# Patient Record
Sex: Male | Born: 1975 | Race: Black or African American | Hispanic: No | Marital: Single | Smoking: Current every day smoker
Health system: Southern US, Community
[De-identification: ages and names within clinical notes are randomized; demographics above are authoritative.]

## PROBLEM LIST (undated history)

## (undated) DIAGNOSIS — K5792 Diverticulitis of intestine, part unspecified, without perforation or abscess without bleeding: Secondary | ICD-10-CM

## (undated) DIAGNOSIS — K219 Gastro-esophageal reflux disease without esophagitis: Secondary | ICD-10-CM

## (undated) HISTORY — PX: NO PAST SURGERIES: SHX2092

## (undated) HISTORY — PX: WISDOM TOOTH EXTRACTION: SHX21

---

## 1998-02-16 ENCOUNTER — Emergency Department (HOSPITAL_COMMUNITY): Admission: EM | Admit: 1998-02-16 | Discharge: 1998-02-16 | Payer: Self-pay | Admitting: Emergency Medicine

## 1998-07-08 ENCOUNTER — Encounter: Payer: Self-pay | Admitting: Emergency Medicine

## 1998-07-08 ENCOUNTER — Emergency Department (HOSPITAL_COMMUNITY): Admission: EM | Admit: 1998-07-08 | Discharge: 1998-07-08 | Payer: Self-pay | Admitting: Emergency Medicine

## 1998-07-20 ENCOUNTER — Emergency Department (HOSPITAL_COMMUNITY): Admission: EM | Admit: 1998-07-20 | Discharge: 1998-07-20 | Payer: Self-pay | Admitting: Emergency Medicine

## 1998-07-21 ENCOUNTER — Encounter: Payer: Self-pay | Admitting: Emergency Medicine

## 1998-07-29 ENCOUNTER — Emergency Department (HOSPITAL_COMMUNITY): Admission: EM | Admit: 1998-07-29 | Discharge: 1998-07-29 | Payer: Self-pay | Admitting: Emergency Medicine

## 2000-02-14 ENCOUNTER — Emergency Department (HOSPITAL_COMMUNITY): Admission: EM | Admit: 2000-02-14 | Discharge: 2000-02-14 | Payer: Self-pay | Admitting: Emergency Medicine

## 2001-04-07 ENCOUNTER — Encounter: Payer: Self-pay | Admitting: *Deleted

## 2001-04-07 ENCOUNTER — Emergency Department (HOSPITAL_COMMUNITY): Admission: EM | Admit: 2001-04-07 | Discharge: 2001-04-07 | Payer: Self-pay | Admitting: Emergency Medicine

## 2011-07-14 ENCOUNTER — Emergency Department (HOSPITAL_COMMUNITY)
Admission: EM | Admit: 2011-07-14 | Discharge: 2011-07-14 | Disposition: A | Discharge status: Home / Self Care | Payer: Self-pay | Attending: Emergency Medicine | Admitting: Emergency Medicine

## 2011-07-14 ENCOUNTER — Encounter (HOSPITAL_COMMUNITY): Payer: Self-pay

## 2011-07-14 DIAGNOSIS — F17200 Nicotine dependence, unspecified, uncomplicated: Secondary | ICD-10-CM | POA: Insufficient documentation

## 2011-07-14 DIAGNOSIS — R109 Unspecified abdominal pain: Secondary | ICD-10-CM | POA: Insufficient documentation

## 2011-07-14 DIAGNOSIS — K921 Melena: Secondary | ICD-10-CM | POA: Insufficient documentation

## 2011-07-14 DIAGNOSIS — R195 Other fecal abnormalities: Secondary | ICD-10-CM

## 2011-07-14 LAB — COMPREHENSIVE METABOLIC PANEL
ALT: 11 U/L (ref 0–53)
AST: 15 U/L (ref 0–37)
Albumin: 3.3 g/dL — ABNORMAL LOW (ref 3.5–5.2)
Alkaline Phosphatase: 56 U/L (ref 39–117)
BUN: 15 mg/dL (ref 6–23)
CO2: 26 mEq/L (ref 19–32)
Calcium: 8.9 mg/dL (ref 8.4–10.5)
Chloride: 106 mEq/L (ref 96–112)
Creatinine, Ser: 0.95 mg/dL (ref 0.50–1.35)
GFR calc Af Amer: >90 mL/min (ref >90)
GFR calc non Af Amer: >90 mL/min (ref >90)
Glucose, Bld: 85 mg/dL (ref 70–99)
Potassium: 4 mEq/L (ref 3.5–5.1)
Sodium: 140 mEq/L (ref 135–145)
Total Bilirubin: 0.2 mg/dL — ABNORMAL LOW (ref 0.3–1.2)
Total Protein: 6.8 g/dL (ref 6.0–8.3)

## 2011-07-14 LAB — CBC
HCT: 41.2 % (ref 39.0–52.0)
Hemoglobin: 14.2 g/dL (ref 13.0–17.0)
MCH: 30.3 pg (ref 26.0–34.0)
MCHC: 34.5 g/dL (ref 30.0–36.0)
MCV: 87.8 fL (ref 78.0–100.0)
Platelets: 197 10*3/uL (ref 150–400)
RBC: 4.69 MIL/uL (ref 4.22–5.81)
RDW: 12.8 % (ref 11.5–15.5)
WBC: 9.8 10*3/uL (ref 4.0–10.5)

## 2011-07-14 LAB — PROTIME-INR
INR: 0.81 (ref 0.00–1.49)
Prothrombin Time: 11.4 seconds — ABNORMAL LOW (ref 11.6–15.2)

## 2011-07-14 LAB — APTT: aPTT: 30 seconds (ref 24–37)

## 2011-07-14 MED ORDER — RANITIDINE HCL 150 MG PO TABS: 150.0000 mg | ORAL_TABLET | Freq: Two times a day (BID) | ORAL | Status: DC

## 2011-07-14 MED ORDER — PANTOPRAZOLE SODIUM 40 MG IV SOLR
40.0000 mg | Freq: Once | INTRAVENOUS | Status: AC
  Administered 2011-07-14: 40 mg via INTRAVENOUS
  Filled 2011-07-14: qty 40

## 2011-07-14 MED ORDER — TRAMADOL HCL 50 MG PO TABS: 50.0000 mg | ORAL_TABLET | Freq: Four times a day (QID) | ORAL | Status: AC | PRN

## 2011-07-14 NOTE — ED Notes (Signed)
Patient given discharge instructions, information, prescriptions, and diet order. Patient states that they adequately understand discharge information given and to return to ED if symptoms return or worsen.    Patient given 2 rx's.

## 2011-07-14 NOTE — ED Notes (Signed)
Pt in from home with abd pain x1 week denies n/v/d states black stool, denies dizziness states pain in abd are is worse when eating spicy foods, states pain is lower abd non radiating

## 2011-07-14 NOTE — ED Provider Notes (Signed)
Medical screening examination/treatment/procedure(s) were performed by non-physician practitioner and as supervising physician I was immediately available for consultation/collaboration.   Rutha Melgoza L Gaby Harney, MD 07/14/11 2245 

## 2011-07-14 NOTE — ED Provider Notes (Signed)
History     CSN: [id number]  Arrival date & time 07/14/11  1716   First MD Initiated Contact with Patient 07/14/11 1853    3  Chief Complaint  Patient presents with  . Abdominal Pain    (Consider location/radiation/quality/duration/timing/severity/associated sxs/prior treatment) The history is provided by the patient and a friend.   36 y/o male INAD c/o dark stool intermittently x1 week. Denies CP, SOB, Palpations, Light headed sensation, N/V. Pt reports increase in flatus and bilateral lower abdominal discomfort. Pt has been taking ibuprofen for discomfort.    History reviewed. No pertinent past medical history.  History reviewed. No pertinent past surgical history.  History reviewed. No pertinent family history.  History  Substance Use Topics  . Smoking status: Current Everyday Smoker  . Smokeless tobacco: Not on file  . Alcohol Use: No      Review of Systems  Gastrointestinal: Positive for abdominal pain. Negative for rectal pain.  All other systems reviewed and are negative.    Allergies  Review of patient's allergies indicates no known allergies.  Home Medications   Current Outpatient Rx  Name Route Sig Dispense Refill  . IBUPROFEN 200 MG PO TABS Oral Take 200 mg by mouth every 6 (six) hours as needed. Pain    . RANITIDINE HCL 150 MG PO TABS Oral Take 1 tablet (150 mg total) by mouth 2 (two) times daily. 60 tablet 0  . TRAMADOL HCL 50 MG PO TABS Oral Take 1 tablet (50 mg total) by mouth every 6 (six) hours as needed for pain. 15 tablet 0    BP 105/67  Pulse 70  Temp 99.1 F (37.3 C) (Oral)  Resp 18  SpO2 99%  Physical Exam  Nursing note and vitals reviewed. Constitutional: He is oriented to person, place, and time. He appears well-developed and well-nourished. No distress.  HENT:  Head: Normocephalic.  Eyes: Conjunctivae and EOM are normal.  Cardiovascular: Normal rate, regular rhythm and normal heart sounds.   Pulmonary/Chest: Breath sounds  normal. He is in respiratory distress.  Abdominal: Soft. Bowel sounds are normal. He exhibits no distension and no mass. There is no tenderness. There is no rebound and no guarding.       One DRE, stool appears greenish, good rectal tone  Musculoskeletal: Normal range of motion.  Neurological: He is alert and oriented to person, place, and time.  Psychiatric: He has a normal mood and affect.    ED Course  Procedures (including critical care time)  Labs Reviewed  COMPREHENSIVE METABOLIC PANEL - Abnormal; Notable for the following:    Albumin 3.3 (*)     Total Bilirubin 0.2 (*)     All other components within normal limits  PROTIME-INR - Abnormal; Notable for the following:    Prothrombin Time 11.4 (*)     All other components within normal limits  CBC  APTT  OCCULT BLOOD X 1 CARD TO LAB, STOOL   No results found.   1. Heme positive stool       MDM  36 y/o male with positive occult blood in stool H/H is stable. Will d/c with zantac and tramadol.  Pt verbalized understanding and agrees with care plan. Outpatient follow-up and return precautions given.          Wynetta Emery, PA-C 07/14/11 2015

## 2011-10-31 ENCOUNTER — Encounter (HOSPITAL_COMMUNITY): Payer: Self-pay

## 2011-10-31 ENCOUNTER — Emergency Department (HOSPITAL_COMMUNITY)
Admission: EM | Admit: 2011-10-31 | Discharge: 2011-10-31 | Disposition: A | Discharge status: Home / Self Care | Payer: Self-pay | Attending: Emergency Medicine | Admitting: Emergency Medicine

## 2011-10-31 ENCOUNTER — Emergency Department (HOSPITAL_COMMUNITY): Payer: Self-pay

## 2011-10-31 DIAGNOSIS — Z79899 Other long term (current) drug therapy: Secondary | ICD-10-CM | POA: Insufficient documentation

## 2011-10-31 DIAGNOSIS — F17200 Nicotine dependence, unspecified, uncomplicated: Secondary | ICD-10-CM | POA: Insufficient documentation

## 2011-10-31 DIAGNOSIS — R0789 Other chest pain: Secondary | ICD-10-CM

## 2011-10-31 DIAGNOSIS — R059 Cough, unspecified: Secondary | ICD-10-CM | POA: Insufficient documentation

## 2011-10-31 DIAGNOSIS — R05 Cough: Secondary | ICD-10-CM | POA: Insufficient documentation

## 2011-10-31 DIAGNOSIS — R071 Chest pain on breathing: Secondary | ICD-10-CM | POA: Insufficient documentation

## 2011-10-31 LAB — CBC WITH DIFFERENTIAL/PLATELET
Basophils Absolute: 0 10*3/uL (ref 0.0–0.1)
Basophils Relative: 1 % (ref 0–1)
Eosinophils Absolute: 0.3 10*3/uL (ref 0.0–0.7)
Eosinophils Relative: 4 % (ref 0–5)
HCT: 44 % (ref 39.0–52.0)
Hemoglobin: 15.3 g/dL (ref 13.0–17.0)
Lymphocytes Relative: 36 % (ref 12–46)
Lymphs Abs: 2.9 10*3/uL (ref 0.7–4.0)
MCH: 30.4 pg (ref 26.0–34.0)
MCHC: 34.8 g/dL (ref 30.0–36.0)
MCV: 87.3 fL (ref 78.0–100.0)
Monocytes Absolute: 0.7 10*3/uL (ref 0.1–1.0)
Monocytes Relative: 9 % (ref 3–12)
Neutro Abs: 4 10*3/uL (ref 1.7–7.7)
Neutrophils Relative %: 50 % (ref 43–77)
Platelets: 187 10*3/uL (ref 150–400)
RBC: 5.04 MIL/uL (ref 4.22–5.81)
RDW: 13 % (ref 11.5–15.5)
WBC: 8 10*3/uL (ref 4.0–10.5)

## 2011-10-31 LAB — POCT I-STAT, CHEM 8
BUN: 19 mg/dL (ref 6–23)
Calcium, Ion: 1.17 mmol/L (ref 1.12–1.23)
Chloride: 105 mEq/L (ref 96–112)
Creatinine, Ser: 1.2 mg/dL (ref 0.50–1.35)
Glucose, Bld: 83 mg/dL (ref 70–99)
HCT: 48 % (ref 39.0–52.0)
Hemoglobin: 16.3 g/dL (ref 13.0–17.0)
Potassium: 4.1 mEq/L (ref 3.5–5.1)
Sodium: 141 mEq/L (ref 135–145)
TCO2: 24 mmol/L (ref 0–100)

## 2011-10-31 LAB — COMPREHENSIVE METABOLIC PANEL
ALT: 16 U/L (ref 0–53)
AST: 18 U/L (ref 0–37)
Albumin: 3.9 g/dL (ref 3.5–5.2)
Alkaline Phosphatase: 62 U/L (ref 39–117)
BUN: 17 mg/dL (ref 6–23)
CO2: 26 mEq/L (ref 19–32)
Calcium: 9.2 mg/dL (ref 8.4–10.5)
Chloride: 102 mEq/L (ref 96–112)
Creatinine, Ser: 0.98 mg/dL (ref 0.50–1.35)
GFR calc Af Amer: >90 mL/min (ref >90)
GFR calc non Af Amer: >90 mL/min (ref >90)
Glucose, Bld: 83 mg/dL (ref 70–99)
Potassium: 4.1 mEq/L (ref 3.5–5.1)
Sodium: 139 mEq/L (ref 135–145)
Total Bilirubin: 0.2 mg/dL — ABNORMAL LOW (ref 0.3–1.2)
Total Protein: 7.7 g/dL (ref 6.0–8.3)

## 2011-10-31 LAB — POCT I-STAT TROPONIN I: Troponin i, poc: 0 ng/mL (ref 0.00–0.08)

## 2011-10-31 MED ORDER — METHOCARBAMOL 500 MG PO TABS: 500.0000 mg | ORAL_TABLET | Freq: Two times a day (BID) | ORAL | Status: DC

## 2011-10-31 MED ORDER — NAPROXEN 500 MG PO TABS: 500.0000 mg | ORAL_TABLET | Freq: Two times a day (BID) | ORAL | Status: DC

## 2011-10-31 NOTE — ED Provider Notes (Signed)
Medical screening examination/treatment/procedure(s) were performed by non-physician practitioner and as supervising physician I was immediately available for consultation/collaboration.   Loren Racer, MD 10/31/11 1550

## 2011-10-31 NOTE — ED Notes (Signed)
Pt presents with NAD- chest pain x 1 day Pt reports when he moves his neck and left arm only he has pain- denies chest pain and SOB at present. denies injury

## 2011-10-31 NOTE — ED Provider Notes (Signed)
History     CSN: [id number]  Arrival date & time 10/31/11  1142   First MD Initiated Contact with Patient 10/31/11 1251      Chief Complaint  Patient presents with  . Chest Pain    (Consider location/radiation/quality/duration/timing/severity/associated sxs/prior treatment) Patient is a 36 y.o. male presenting with chest pain. The history is provided by the patient, a significant other and medical records.  Chest Pain Primary symptoms include cough. Pertinent negatives for primary symptoms include no fever, no fatigue, no shortness of breath, no wheezing, no abdominal pain, no nausea and no vomiting.  Pertinent negatives for associated symptoms include no diaphoresis.     Shane Arroyo is a 36 y.o. male presents to the emergency department complaining of chest pain.  The onset of the symptoms was  gradual starting 1 day ago; acutely worsening this morning.  The patient has associated chest pain with movement of the L arm or neck.  The symptoms have been  intermittent, gradually worsened.  Movement makes the symptoms worse and hot shower makes symptoms better for a short time.  The patient denies fever, chills, headache, neck pain, neck stiffness, shortness of breath, abdominal pain, nausea, vomiting, diarrhea, weakness, numbness, syncope.  Pt states the pain began yesterday morning and was mild.  He tolerated the pain throughout the day and when he awoke this morning it was acutely worse with movement.  He took a hot shower which made it feel better for a short time but the pain returned after 30 min.  Pt denies illness, cough, congestion, or strenuous lifting or activity.  Pt states cocaine use on Saturday without palpitations or other adverse side effects at the time.  Pt states pain feels like a pulled muscle, is located on the L side of the chest, rated at a 2/10, denies radiation.  Pt states the pain makes him feel scared.     History reviewed. No pertinent past medical  history.  History reviewed. No pertinent past surgical history.  No family history on file.  History  Substance Use Topics  . Smoking status: Current Every Day Smoker  . Smokeless tobacco: Not on file  . Alcohol Use: No      Review of Systems  Constitutional: Negative for fever, diaphoresis, appetite change, fatigue and unexpected weight change.  HENT: Negative for mouth sores, neck pain and neck stiffness.   Eyes: Negative for visual disturbance.  Respiratory: Positive for cough. Negative for chest tightness, shortness of breath and wheezing.   Cardiovascular: Positive for chest pain.  Gastrointestinal: Negative for nausea, vomiting, abdominal pain, diarrhea and constipation.  Genitourinary: Negative for dysuria, urgency, frequency and hematuria.  Musculoskeletal: Negative for back pain.  Skin: Negative for rash.  Neurological: Negative for syncope, light-headedness and headaches.  Hematological: Does not bruise/bleed easily.  Psychiatric/Behavioral: Negative for disturbed wake/sleep cycle. The patient is not nervous/anxious.   All other systems reviewed and are negative.    Allergies  Review of patient's allergies indicates no known allergies.  Home Medications   Current Outpatient Rx  Name Route Sig Dispense Refill  . NAPROXEN SODIUM 220 MG PO TABS Oral Take 440 mg by mouth 2 (two) times daily with a meal.    . METHOCARBAMOL 500 MG PO TABS Oral Take 1 tablet (500 mg total) by mouth 2 (two) times daily. 20 tablet 0    BP 90/52  Pulse 73  Temp 98.1 F (36.7 C) (Oral)  Resp 16  SpO2 99%  Physical Exam  Nursing note and vitals reviewed. Constitutional: He appears well-developed and well-nourished. No distress.  HENT:  Head: Normocephalic and atraumatic.  Mouth/Throat: Oropharynx is clear and moist. No oropharyngeal exudate.  Eyes: Conjunctivae normal are normal. No scleral icterus.  Neck: Normal range of motion. Neck supple.       Full ROM without pain in the  neck, but pain in the chest is reproduced at extremes of ROM to the L and R.    Cardiovascular: Normal rate, regular rhythm, normal heart sounds and intact distal pulses.  Exam reveals no gallop and no friction rub.   No murmur heard. Pulmonary/Chest: Effort normal and breath sounds normal. No respiratory distress. He has no wheezes. He has no rales. He exhibits no tenderness (no pain to palpation of the sternum or chest.  ).  Abdominal: Soft. Bowel sounds are normal. He exhibits no distension and no mass. There is no tenderness. There is no rebound and no guarding.  Musculoskeletal: Normal range of motion. He exhibits no edema.       Pain in the L chest reproduced with lifting of the L arm and increased with reaching across the chest.    Neurological: He is alert.       Speech is clear and goal oriented Moves extremities without ataxia  Skin: Skin is warm and dry. No rash noted. He is not diaphoretic. No erythema.  Psychiatric: He has a normal mood and affect.    ED Course  Procedures (including critical care time)   Results for orders placed during the hospital encounter of 10/31/11  COMPREHENSIVE METABOLIC PANEL      Component Value Range   Sodium 139  135 - 145 mEq/L   Potassium 4.1  3.5 - 5.1 mEq/L   Chloride 102  96 - 112 mEq/L   CO2 26  19 - 32 mEq/L   Glucose, Bld 83  70 - 99 mg/dL   BUN 17  6 - 23 mg/dL   Creatinine, Ser 1.61  0.50 - 1.35 mg/dL   Calcium 9.2  8.4 - 09.6 mg/dL   Total Protein 7.7  6.0 - 8.3 g/dL   Albumin 3.9  3.5 - 5.2 g/dL   AST 18  0 - 37 U/L   ALT 16  0 - 53 U/L   Alkaline Phosphatase 62  39 - 117 U/L   Total Bilirubin 0.2 (*) 0.3 - 1.2 mg/dL   GFR calc non Af Amer >90  >90 mL/min   GFR calc Af Amer >90  >90 mL/min  POCT I-STAT, CHEM 8      Component Value Range   Sodium 141  135 - 145 mEq/L   Potassium 4.1  3.5 - 5.1 mEq/L   Chloride 105  96 - 112 mEq/L   BUN 19  6 - 23 mg/dL   Creatinine, Ser 0.45  0.50 - 1.35 mg/dL   Glucose, Bld 83  70 - 99  mg/dL   Calcium, Ion 4.09  8.11 - 1.23 mmol/L   TCO2 24  0 - 100 mmol/L   Hemoglobin 16.3  13.0 - 17.0 g/dL   HCT 91.4  78.2 - 95.6 %  POCT I-STAT TROPONIN I      Component Value Range   Troponin i, poc 0.00  0.00 - 0.08 ng/mL   Comment 3           CBC WITH DIFFERENTIAL      Component Value Range   WBC 8.0  4.0 - 10.5 K/uL  RBC 5.04  4.22 - 5.81 MIL/uL   Hemoglobin 15.3  13.0 - 17.0 g/dL   HCT 16.1  09.6 - 04.5 %   MCV 87.3  78.0 - 100.0 fL   MCH 30.4  26.0 - 34.0 pg   MCHC 34.8  30.0 - 36.0 g/dL   RDW 40.9  81.1 - 91.4 %   Platelets 187  150 - 400 K/uL   Neutrophils Relative 50  43 - 77 %   Neutro Abs 4.0  1.7 - 7.7 K/uL   Lymphocytes Relative 36  12 - 46 %   Lymphs Abs 2.9  0.7 - 4.0 K/uL   Monocytes Relative 9  3 - 12 %   Monocytes Absolute 0.7  0.1 - 1.0 K/uL   Eosinophils Relative 4  0 - 5 %   Eosinophils Absolute 0.3  0.0 - 0.7 K/uL   Basophils Relative 1  0 - 1 %   Basophils Absolute 0.0  0.0 - 0.1 K/uL   Dg Chest 2 View  10/31/2011  *RADIOLOGY REPORT*  Clinical Data: Chest pain  CHEST - 2 VIEW  Comparison: None.  Findings: Heart size and vascularity are normal.  Lungs are clear without infiltrate or effusion.  Negative for mass lesion.  IMPRESSION: Negative   Original Report Authenticated By: Camelia Phenes, M.D.     ECG:  Date: 10/31/2011  Rate: 82  Rhythm: normal sinus rhythm  QRS Axis: normal  Intervals: normal  ST/T Wave abnormalities: normal  Conduction Disutrbances:none  Narrative Interpretation: Non-ischemic ECG  Old EKG Reviewed: none available   1. Chest pain, musculoskeletal   2. Left-sided chest wall pain     MDM  Lacharde G Purtle presents with reproducible chest pain.  Likely chest wall pain as it is atypical, sharp in nature and reproducible.  Patient is to be discharged with recommendation to follow up with PCP in regards to today's hospital visit. Chest pain is not likely of cardiac or pulmonary etiology d/t presentation, perc negative,  VSS, no tracheal deviation, no JVD or new murmur, RRR, breath sounds equal bilaterally, EKG without acute abnormalities, negative troponin, and negative CXR. Pt has been advised to start naprosyn and return to the ED if CP becomes exertional, associated with diaphoresis or nausea, radiates to left jaw/arm, worsens or becomes concerning in any way. Pt appears reliable for follow up and is agreeable to discharge.   Case has been discussed with Dr. Ranae Palms who agrees with the above plan to discharge.   1. Medications: naprosyn, robaxin 2. Treatment: rest, gentle stretching, take medications as prescribed 3. Follow Up: with PCP for further evaluation        Dierdre Forth, PA-C 10/31/11 1533

## 2012-04-30 ENCOUNTER — Ambulatory Visit: Payer: 59 | Admitting: Physician Assistant

## 2012-04-30 VITALS — BP 116/85 | HR 84 | Temp 98.0°F | Resp 16 | Ht 67.0 in | Wt 270.0 lb

## 2012-04-30 DIAGNOSIS — B353 Tinea pedis: Secondary | ICD-10-CM

## 2012-04-30 DIAGNOSIS — L299 Pruritus, unspecified: Secondary | ICD-10-CM

## 2012-04-30 LAB — POCT SKIN KOH: Skin KOH, POC: NEGATIVE

## 2012-04-30 MED ORDER — KETOCONAZOLE 2 % EX CREA: TOPICAL_CREAM | Freq: Every day | CUTANEOUS | Status: DC

## 2012-04-30 NOTE — Patient Instructions (Addendum)
Begin using the cream to the affected areas and between your toes once daily for 4 weeks.  If you are not better after 4 weeks, please let me know.  Make sure you keep the feet as dry as possible but changing socks, wearing open shoes, using foot powder, etc.   Athlete's Foot Athlete's foot (tinea pedis) is a fungal infection of the skin on the feet. It often occurs on the skin between the toes or underneath the toes. It can also occur on the soles of the feet. Athlete's foot is more likely to occur in hot, humid weather. Not washing your feet or changing your socks often enough can contribute to athlete's foot. The infection can spread from person to person (contagious). CAUSES Athlete's foot is caused by a fungus. This fungus thrives in warm, moist places. Most people get athlete's foot by sharing shower stalls, towels, and wet floors with an infected person. People with weakened immune systems, including those with diabetes, may be more likely to get athlete's foot. SYMPTOMS   Itchy areas between the toes or on the soles of the feet.  White, flaky, or scaly areas between the toes or on the soles of the feet.  Tiny, intensely itchy blisters between the toes or on the soles of the feet.  Tiny cuts on the skin. These cuts can develop a bacterial infection.  Thick or discolored toenails. DIAGNOSIS  Your caregiver can usually tell what the problem is by doing a physical exam. Your caregiver may also take a skin sample from the rash area. The skin sample may be examined under a microscope, or it may be tested to see if fungus will grow in the sample. A sample may also be taken from your toenail for testing. TREATMENT  Over-the-counter and prescription medicines can be used to kill the fungus. These medicines are available as powders or creams. Your caregiver can suggest medicines for you. Fungal infections respond slowly to treatment. You may need to continue using your medicine for several  weeks. PREVENTION   Do not share towels.  Wear sandals in wet areas, such as shared locker rooms and shared showers.  Keep your feet dry. Wear shoes that allow air to circulate. Wear cotton or wool socks. HOME CARE INSTRUCTIONS   Take medicines as directed by your caregiver. Do not use steroid creams on athlete's foot.  Keep your feet clean and cool. Wash your feet daily and dry them thoroughly, especially between your toes.  Change your socks every day. Wear cotton or wool socks. In hot climates, you may need to change your socks 2 to 3 times per day.  Wear sandals or canvas tennis shoes with good air circulation.  If you have blisters, soak your feet in Burow's solution or Epsom salts for 20 to 30 minutes, 2 times a day to dry out the blisters. Make sure you dry your feet thoroughly afterward. SEEK MEDICAL CARE IF:   You have a fever.  You have swelling, soreness, warmth, or redness in your foot.  You are not getting better after 7 days of treatment.  You are not completely cured after 30 days.  You have any problems caused by your medicines. MAKE SURE YOU:   Understand these instructions.  Will watch your condition.  Will get help right away if you are not doing well or get worse. Document Released: 12/23/1999 Document Revised: 03/19/2011 Document Reviewed: 10/13/2010 [facility] Patient Information 2013 [place], [place].

## 2012-04-30 NOTE — Progress Notes (Signed)
  Subjective:    Patient ID: Shane Arroyo, male    DOB: 12/13/1975, 37 y.o.   MRN: [id number]  HPI    Shane Arroyo is a very pleasant 37 yr old male here with concern for a rash on his feet.  His feet have been itchy for 1-2 months now.  Denies pain or drainage.  He has been using OTC antifungal cream which does not appear to be working.   He has had episodes of athlete's foot in the past.  States it recurs every couple years.  Has used both topical and oral medication in the past.  He does wear heavy work boots almost all the time.  No other contacts have symptoms.  He otherwise feels ok.     Review of Systems  Skin: Positive for rash (feet).  All other systems reviewed and are negative.       Objective:   Physical Exam  Vitals reviewed. Constitutional: He is oriented to person, place, and time. He appears well-developed and well-nourished. No distress.  HENT:  Head: Normocephalic and atraumatic.  Eyes: Conjunctivae are normal. No scleral icterus.  Pulmonary/Chest: Effort normal.  Neurological: He is alert and oriented to person, place, and time.  Skin: Skin is warm and dry. Rash noted.  Few erythematous scattered across dorsum of left foot, some scaling at the arch of the foot; erythematous scaling areas at arch of right foot; 4th/5th web spaces with maceration bilaterally  Psychiatric: He has a normal mood and affect. His behavior is normal.     Filed Vitals:   04/30/12 1314  BP: 116/85  Pulse: 84  Temp: 98 F (36.7 C)  Resp: 16     Results for orders placed in visit on 04/30/12  POCT SKIN KOH      Result Value Range   Skin KOH, POC Negative          Assessment & Plan:  Tinea pedis - Plan: ketoconazole (NIZORAL) 2 % cream  Pruritus - Plan: POCT Skin KOH  Shane Arroyo is a very pleasant 37 yr old male with pruritic rash of bilateral feet.  KOH scraping is negative but clinically this appears to be tinea.  Will treat with ketoconazole cream daily x 4 weeks.  Discussed with pt that he needs to try to keep his feet dry as much as possible - clean socks, foot powder, open to air when possible.  If not improved after 4 weeks of topical therapy, pt will let me know.  At that point we may need to try oral therapy.

## 2012-11-13 ENCOUNTER — Ambulatory Visit (INDEPENDENT_AMBULATORY_CARE_PROVIDER_SITE_OTHER): Payer: 59 | Admitting: Family Medicine

## 2012-11-13 VITALS — BP 120/80 | HR 86 | Temp 97.5°F | Resp 16 | Ht 67.0 in | Wt 253.0 lb

## 2012-11-13 DIAGNOSIS — Z202 Contact with and (suspected) exposure to infections with a predominantly sexual mode of transmission: Secondary | ICD-10-CM

## 2012-11-13 DIAGNOSIS — F1410 Cocaine abuse, uncomplicated: Secondary | ICD-10-CM

## 2012-11-13 DIAGNOSIS — E669 Obesity, unspecified: Secondary | ICD-10-CM

## 2012-11-13 DIAGNOSIS — Z2089 Contact with and (suspected) exposure to other communicable diseases: Secondary | ICD-10-CM

## 2012-11-13 DIAGNOSIS — F1210 Cannabis abuse, uncomplicated: Secondary | ICD-10-CM

## 2012-11-13 DIAGNOSIS — Z0000 Encounter for general adult medical examination without abnormal findings: Secondary | ICD-10-CM

## 2012-11-13 DIAGNOSIS — F1490 Cocaine use, unspecified, uncomplicated: Secondary | ICD-10-CM

## 2012-11-13 LAB — LIPID PANEL
Cholesterol: 119 mg/dL (ref 0–200)
HDL: 41 mg/dL (ref >39)
LDL Cholesterol: 65 mg/dL (ref 0–99)
Total CHOL/HDL Ratio: 2.9 Ratio
Triglycerides: 67 mg/dL (ref <150)
VLDL: 13 mg/dL (ref 0–40)

## 2012-11-13 LAB — POCT CBC
Granulocyte percent: 56.6 %G (ref 37–80)
HCT, POC: 47.7 % (ref 43.5–53.7)
Hemoglobin: 15.4 g/dL (ref 14.1–18.1)
Lymph, poc: 2.7 (ref 0.6–3.4)
MCH, POC: 30.1 pg (ref 27–31.2)
MCHC: 32.3 g/dL (ref 31.8–35.4)
MCV: 93.1 fL (ref 80–97)
MID (cbc): 0.5 (ref 0–0.9)
MPV: 9 fL (ref 0–99.8)
POC Granulocyte: 4.2 (ref 2–6.9)
POC LYMPH PERCENT: 36.7 %L (ref 10–50)
POC MID %: 6.7 %M (ref 0–12)
Platelet Count, POC: 212 10*3/uL (ref 142–424)
RBC: 5.12 M/uL (ref 4.69–6.13)
RDW, POC: 14.3 %
WBC: 7.4 10*3/uL (ref 4.6–10.2)

## 2012-11-13 LAB — COMPREHENSIVE METABOLIC PANEL
ALT: 14 U/L (ref 0–53)
AST: 16 U/L (ref 0–37)
Albumin: 4.2 g/dL (ref 3.5–5.2)
Alkaline Phosphatase: 55 U/L (ref 39–117)
BUN: 13 mg/dL (ref 6–23)
CO2: 26 mEq/L (ref 19–32)
Calcium: 9.6 mg/dL (ref 8.4–10.5)
Chloride: 103 mEq/L (ref 96–112)
Creat: 1.02 mg/dL (ref 0.50–1.35)
Glucose, Bld: 83 mg/dL (ref 70–99)
Potassium: 4.3 mEq/L (ref 3.5–5.3)
Sodium: 137 mEq/L (ref 135–145)
Total Bilirubin: 0.3 mg/dL (ref 0.3–1.2)
Total Protein: 7.6 g/dL (ref 6.0–8.3)

## 2012-11-13 NOTE — Progress Notes (Signed)
Complete physical:  History: Mr. Shane Arroyo is a 37 year old man here for physical examination. He has generally been pretty healthy. He is concerned about his STD risks. His girlfriend had concerns about HSV, as to whether he had spread that. He does not know of having had herpes. He had a fungus on his feet and he would like some more of the cream for that.  Past medical history: Operations: None Hospitalizations: None Major illnesses: None Regular medications: None Allergies: None  Family history: Mother is healthy Father has diabetes and high blood pressure Siblings are all living and well. His grandparents are deceased.  Social history: He does not work. He was incarcerated for 4 years until 2010, for drug trafficking. He subsequently lives off of his mother and girlfriend and others. He smokes one pack of cigarettes a day. Drinks a couple drinks a week. He is a regular user of marijuana. He smokes it every day. He uses occasional cocaine a couple times a week. He is single but sexually involved. Has a high school education.  Review of systems: Constitutional: Unremarkable HEENT: Unremarkable Respiratory: Unremarkable Cardiovascular: Unremarkable Gastrointestinal: Unremarkable Endocrinologic: Unremarkable Genitourinary: Unremarkable Musculoskeletal: Unremarkable Allergy immunology: Unremarkable Neurological: Unremarkable Hematologic: Unremarkable Psychiatric: Unremarkable Dermatologic had a ringworm infection on his feet, which resolved with ketoconazole, but he would like some more cream to have on hand if necessary  Physical examination: Moderately overweight Afro-American male in no acute distress. Fully alert and oriented. His TMs are normal. Eyes PERRLA. Fundi benign. Discs flat. Throat clear. Teeth good. Neck supple without nodes or thyromegaly. No carotid bruits. Chest is clear to auscultation. Heart regular without murmurs gallops or arrhythmias. Abdomen soft without  mass or tenderness. Normal male external genitalia with testes descended. No evidence of STDs. One little skin tag on the left proximal aspect of the shaft of the penis. No hernias. Extremities unremarkable. Skin unremarkable.  Assessment: Normal physical examination Overweight Excessive substance use Tobacco abuse Possible STD exposure  Plan: Check STD testing and labs. Talked to him about changing his lifestyle choices if he chooses to be healthy. He needs to get away from the substances and tobacco. He needs to get himself a job. He needs to continue getting regular exercise but learned eat less and lose weight.

## 2012-11-13 NOTE — Patient Instructions (Signed)
If you have more fungus on your feet, use over-the-counter terbinafine cream  Exercise regularly  Eat less. You to followup on fruits and vegetables and lean meats  Stopped smoking pot and using cocaine  Get a job or spleen your time volunteering  I will let you know the results of your test

## 2012-11-14 LAB — GC/CHLAMYDIA PROBE AMP
CT Probe RNA: NEGATIVE
GC Probe RNA: NEGATIVE

## 2012-11-14 LAB — HIV ANTIBODY (ROUTINE TESTING W REFLEX): HIV: NONREACTIVE

## 2012-11-14 LAB — RPR

## 2012-11-14 LAB — HSV(HERPES SIMPLEX VRS) I + II AB-IGG
HSV 1 Glycoprotein G Ab, IgG: 0.72 IV
HSV 2 Glycoprotein G Ab, IgG: 7.95 IV — ABNORMAL HIGH

## 2012-11-17 ENCOUNTER — Encounter: Payer: Self-pay | Admitting: Family Medicine

## 2012-12-27 ENCOUNTER — Emergency Department (HOSPITAL_COMMUNITY)
Admission: EM | Admit: 2012-12-27 | Discharge: 2012-12-28 | Disposition: A | Discharge status: Home / Self Care | Payer: 59 | Attending: Emergency Medicine | Admitting: Emergency Medicine

## 2012-12-27 ENCOUNTER — Encounter (HOSPITAL_COMMUNITY): Payer: Self-pay | Admitting: Emergency Medicine

## 2012-12-27 DIAGNOSIS — R0789 Other chest pain: Secondary | ICD-10-CM | POA: Insufficient documentation

## 2012-12-27 DIAGNOSIS — R52 Pain, unspecified: Secondary | ICD-10-CM | POA: Insufficient documentation

## 2012-12-27 DIAGNOSIS — IMO0001 Reserved for inherently not codable concepts without codable children: Secondary | ICD-10-CM | POA: Insufficient documentation

## 2012-12-27 DIAGNOSIS — B9789 Other viral agents as the cause of diseases classified elsewhere: Secondary | ICD-10-CM

## 2012-12-27 DIAGNOSIS — R5383 Other fatigue: Secondary | ICD-10-CM | POA: Insufficient documentation

## 2012-12-27 DIAGNOSIS — J069 Acute upper respiratory infection, unspecified: Secondary | ICD-10-CM | POA: Insufficient documentation

## 2012-12-27 DIAGNOSIS — F17200 Nicotine dependence, unspecified, uncomplicated: Secondary | ICD-10-CM | POA: Insufficient documentation

## 2012-12-27 DIAGNOSIS — R5381 Other malaise: Secondary | ICD-10-CM | POA: Insufficient documentation

## 2012-12-27 MED ORDER — ACETAMINOPHEN 325 MG PO TABS
650.0000 mg | ORAL_TABLET | Freq: Once | ORAL | Status: AC
  Administered 2012-12-28: 650 mg via ORAL
  Filled 2012-12-27: qty 2

## 2012-12-27 NOTE — ED Notes (Addendum)
Patient presents with flu like symptoms starting today, cough, runny nose, and generalized body aches. Patient febrile on arrival to ED. Patient has taken cough drops at home without relief. Patient denies receiving flu shot this year. Patient reports recent contact with someone at work who had similar symptoms.

## 2012-12-27 NOTE — ED Notes (Signed)
Pt states just out of blue he started feeling bad today,  Runny nose , cough,  Body aches. Hasn't taken anything but halls doesn't know if he has a fever,  Hasn't checked it.

## 2012-12-28 ENCOUNTER — Emergency Department (HOSPITAL_COMMUNITY): Payer: 59

## 2012-12-28 MED ORDER — IBUPROFEN 800 MG PO TABS: 800.0000 mg | ORAL_TABLET | Freq: Three times a day (TID) | ORAL | Status: DC

## 2012-12-28 MED ORDER — ALBUTEROL SULFATE HFA 108 (90 BASE) MCG/ACT IN AERS: 2.0000 | INHALATION_SPRAY | RESPIRATORY_TRACT | Status: DC | PRN

## 2012-12-28 NOTE — ED Notes (Signed)
Katie, PA at bedside.

## 2012-12-28 NOTE — ED Provider Notes (Signed)
CSN: [id number]     Arrival date & time 12/27/12  2323 History   First MD Initiated Contact with Patient 12/27/12 2341     Chief Complaint  Patient presents with  . Fatigue  . Cough   (Consider location/radiation/quality/duration/timing/severity/associated sxs/prior Treatment) HPI History provided by pt.   Pt presents w/ acute onset body aches, generalized weakness, fatigue this afternoon.  Associated w/ mild cough, diffuse chest tightness and nasal congestion.  Denies sore throat, N/V/D, urinary sx, rash.  Has taken theraflu w/out relief.  Recent URI exposure.   History reviewed. No pertinent past medical history. No past surgical history on file. Family History  Problem Relation Age of Onset  . Diabetes Father   . Cancer Maternal Grandmother   . Alcohol abuse Maternal Grandfather   . Diabetes Paternal Grandmother   . Diabetes Paternal Grandfather    History  Substance Use Topics  . Smoking status: Current Every Day Smoker  . Smokeless tobacco: Not on file  . Alcohol Use: No    Review of Systems  All other systems reviewed and are negative.    Allergies  Review of patient's allergies indicates no known allergies.  Home Medications   Current Outpatient Rx  Name  Route  Sig  Dispense  Refill  . naproxen sodium (ANAPROX) 220 MG tablet   Oral   Take 220 mg by mouth 2 (two) times daily as needed (pain).         . pseudoephedrine-acetaminophen (TYLENOL SINUS) 30-500 MG TABS   Oral   Take 1 tablet by mouth every 6 (six) hours as needed (cold symptoms).          BP 125/96  Pulse 120  Temp(Src) 100.3 F (37.9 C) (Oral)  Resp 22  SpO2 96% Physical Exam  Nursing note and vitals reviewed. Constitutional: He is oriented to person, place, and time. He appears well-developed and well-nourished. No distress.  Uncomfortable appearing  HENT:  Head: Normocephalic and atraumatic.  Mouth/Throat: Oropharynx is clear and moist. No oropharyngeal exudate.  Eyes:  Normal  appearance  Neck: Normal range of motion.  Cardiovascular: Normal rate and regular rhythm.   Pulmonary/Chest: Effort normal and breath sounds normal. No respiratory distress.  Musculoskeletal: Normal range of motion.  Lymphadenopathy:    He has no cervical adenopathy.  Neurological: He is alert and oriented to person, place, and time.  Skin: Skin is warm and dry. No rash noted.  Psychiatric: He has a normal mood and affect. His behavior is normal.    ED Course  Procedures (including critical care time) Labs Review Labs Reviewed - No data to display Imaging Review Dg Chest 2 View  12/28/2012   CLINICAL DATA:  37 year old male with cough, fever and body aches.  EXAM: CHEST  2 VIEW  COMPARISON:  10/31/2011  FINDINGS: The cardiomediastinal silhouette is unremarkable.  There is no evidence of focal airspace disease, pulmonary edema, suspicious pulmonary nodule/mass, pleural effusion, or pneumothorax. No acute bony abnormalities are identified.  IMPRESSION: No evidence of active cardiopulmonary disease.   Electronically Signed   By: Laveda Abbe M.D.   On: 12/28/2012 01:56    EKG Interpretation   None       MDM   1. Viral respiratory illness    37yo healthy M presents w/ flu-like sx since this afternoon.  Has diffuse tightness in chest, particularly when he coughs. Borderline febrile and tachycardic, non-toxic appearing, no respiratory distress, nml breath sounds on initial exam.  Pt drinking fluids and  has received tylenol.  Will reassess shortly.  12:25 AM   CXR ordered because pt remained tachycardic despite improvement in temperature and drinking fluids.  Neg for pna.  Results discussed w/ pt.  Will treat symptomatically for viral URI.  Prescribed an albuterol inhaler for chest tightness/cough, as well as 800mg  ibuprofen.  Recommended sudafed, rest and fluids.  Return precautions discussed.  3:20 AM     Otilio Miu, PA-C 12/28/12 2145

## 2012-12-28 NOTE — ED Notes (Addendum)
Patient given water x3, and of gingerale x2. Patient encouraged to drink fluids.

## 2012-12-28 NOTE — ED Provider Notes (Signed)
Medical screening examination/treatment/procedure(s) were performed by non-physician practitioner and as supervising physician I was immediately available for consultation/collaboration.    Olivia Mackie, MD 12/28/12 2158

## 2012-12-28 NOTE — ED Notes (Signed)
Patient drank of water previously placed a bedside. Patient sleeping when nurse entered room for reassessment.

## 2012-12-28 NOTE — ED Notes (Signed)
Patient awakened and encouraged to drink his fluids. Patient informed the alternative is to have an IV placed.

## 2013-04-21 ENCOUNTER — Telehealth: Payer: Self-pay

## 2013-04-21 NOTE — Telephone Encounter (Signed)
Labs good except for he has had genital herpes. No real need for treatment unless he has any active sores on his genitalia. If he does, we will treat. If not, he can come in in the future if he gets any active lesions like blisters on penis or surrounding areas. Treatment will not make a significant difference at this time. He is potentially infectious to his partners, so should always wear a condom. There is no way of telling how long he has been carrying the virus.   Reviewed labs with patient, patient states understanding.

## 2013-07-06 ENCOUNTER — Ambulatory Visit (INDEPENDENT_AMBULATORY_CARE_PROVIDER_SITE_OTHER): Payer: 59 | Admitting: Emergency Medicine

## 2013-07-06 VITALS — BP 110/70 | HR 70 | Temp 97.5°F | Resp 16 | Ht 66.5 in | Wt 257.0 lb

## 2013-07-06 DIAGNOSIS — K2970 Gastritis, unspecified, without bleeding: Secondary | ICD-10-CM

## 2013-07-06 DIAGNOSIS — K2990 Gastroduodenitis, unspecified, without bleeding: Principal | ICD-10-CM

## 2013-07-06 DIAGNOSIS — B353 Tinea pedis: Secondary | ICD-10-CM

## 2013-07-06 MED ORDER — SUCRALFATE 1 G PO TABS: ORAL_TABLET | ORAL | Status: DC

## 2013-07-06 MED ORDER — LANSOPRAZOLE 30 MG PO CPDR: 30.0000 mg | DELAYED_RELEASE_CAPSULE | Freq: Every day | ORAL | Status: DC

## 2013-07-06 MED ORDER — TERBINAFINE HCL 250 MG PO TABS: 250.0000 mg | ORAL_TABLET | Freq: Every day | ORAL | Status: DC

## 2013-07-06 NOTE — Progress Notes (Signed)
[facility] [address] [place] [address] [phone]  Date:  07/06/2013   Name:  Shane Arroyo   DOB:  [birth year]   MRN:  [id number]  PCP:  No PCP Per Patient    Chief Complaint: Abdominal Pain and Tinea Pedis   History of Present Illness:  Shane Arroyo is a 38 y.o. very pleasant male patient who presents with the following:  1 month duration pain in epigastrium.  No nausea or vomiting.  Eating provokes pain, particularly "acid" foods, tomato sauce, coffee.  No hematemesis, melena or hematochezia.  No stool change.  No jaundice.  Says he has had weight loss but offers no numbers.  Smokes. No improvement with over the counter medications or other home remedies. Denies other complaint or health concern today.   There are no active problems to display for this patient.   History reviewed. No pertinent past medical history.  History reviewed. No pertinent past surgical history.  History  Substance Use Topics  . Smoking status: Current Every Day Smoker  . Smokeless tobacco: Not on file  . Alcohol Use: No    Family History  Problem Relation Age of Onset  . Diabetes Father   . Cancer Maternal Grandmother   . Alcohol abuse Maternal Grandfather   . Diabetes Paternal Grandmother   . Diabetes Paternal Grandfather     No Known Allergies  Medication list has been reviewed and updated.  Current Outpatient Prescriptions on File Prior to Visit  Medication Sig Dispense Refill  . albuterol (PROVENTIL HFA;VENTOLIN HFA) 108 (90 BASE) MCG/ACT inhaler Inhale 2 puffs into the lungs every 4 (four) hours as needed for wheezing or shortness of breath.  1 Inhaler  0  . naproxen sodium (ANAPROX) 220 MG tablet Take 220 mg by mouth 2 (two) times daily as needed (pain).       No current facility-administered medications on file prior to visit.    Review of Systems:  As per HPI, otherwise negative.    Physical Examination: Filed Vitals:   07/06/13  1416  BP: 110/70  Pulse: 70  Temp: 97.5 F (36.4 C)  Resp: 16   Filed Vitals:   07/06/13 1416  Height: 5' 6.5" (1.689 m)  Weight: 257 lb (116.574 kg)   Body mass index is 40.86 kg/(m^2). Ideal Body Weight: Weight in (lb) to have BMI = 25: 156.9  GEN: WDWN, NAD, Non-toxic, A & O x 3 HEENT: Atraumatic, Normocephalic. Neck supple. No masses, No LAD. Ears and Nose: No external deformity. CV: RRR, No M/G/R. No JVD. No thrill. No extra heart sounds. PULM: CTA B, no wheezes, crackles, rhonchi. No retractions. No resp. distress. No accessory muscle use. ABD: S, tender epigastrium, ND, +BS. No rebound. No HSM. EXTR: No c/c/e NEURO Normal gait.  PSYCH: Normally interactive. Conversant. Not depressed or anxious appearing.  Calm demeanor.  Skin:  Tinea pedis  Assessment and Plan: Gastritis vs PUD Tinea pedis lamisil carafate Prevacid Labs pending  Signed,  Ellison Carwin, MD

## 2013-07-06 NOTE — Patient Instructions (Signed)

## 2013-07-07 LAB — COMPREHENSIVE METABOLIC PANEL
ALT: 12 U/L (ref 0–53)
AST: 14 U/L (ref 0–37)
Albumin: 4.1 g/dL (ref 3.5–5.2)
Alkaline Phosphatase: 56 U/L (ref 39–117)
BUN: 15 mg/dL (ref 6–23)
CO2: 29 mEq/L (ref 19–32)
Calcium: 9.1 mg/dL (ref 8.4–10.5)
Chloride: 103 mEq/L (ref 96–112)
Creat: 1.02 mg/dL (ref 0.50–1.35)
Glucose, Bld: 78 mg/dL (ref 70–99)
Potassium: 4.4 mEq/L (ref 3.5–5.3)
Sodium: 138 mEq/L (ref 135–145)
Total Bilirubin: 0.4 mg/dL (ref 0.2–1.2)
Total Protein: 6.9 g/dL (ref 6.0–8.3)

## 2013-07-07 LAB — CBC WITH DIFFERENTIAL/PLATELET
Basophils Absolute: 0 10*3/uL (ref 0.0–0.1)
Basophils Relative: 0 % (ref 0–1)
Eosinophils Absolute: 0.2 10*3/uL (ref 0.0–0.7)
Eosinophils Relative: 2 % (ref 0–5)
HCT: 44.8 % (ref 39.0–52.0)
Hemoglobin: 15.5 g/dL (ref 13.0–17.0)
Lymphocytes Relative: 34 % (ref 12–46)
Lymphs Abs: 3.1 10*3/uL (ref 0.7–4.0)
MCH: 29.4 pg (ref 26.0–34.0)
MCHC: 34.6 g/dL (ref 30.0–36.0)
MCV: 84.8 fL (ref 78.0–100.0)
Monocytes Absolute: 0.9 10*3/uL (ref 0.1–1.0)
Monocytes Relative: 10 % (ref 3–12)
Neutro Abs: 4.9 10*3/uL (ref 1.7–7.7)
Neutrophils Relative %: 54 % (ref 43–77)
Platelets: 217 10*3/uL (ref 150–400)
RBC: 5.28 MIL/uL (ref 4.22–5.81)
RDW: 13.5 % (ref 11.5–15.5)
WBC: 9.1 10*3/uL (ref 4.0–10.5)

## 2013-07-07 LAB — H. PYLORI ANTIBODY, IGG: H Pylori IgG: <0.4 {ISR}

## 2013-07-15 ENCOUNTER — Telehealth: Payer: Self-pay

## 2013-07-15 NOTE — Telephone Encounter (Signed)
yep

## 2013-07-15 NOTE — Telephone Encounter (Signed)
Spoke to [employer] she understands to purchase the Prevacid OTC and agrees with plan.

## 2013-07-15 NOTE — Telephone Encounter (Signed)
terbinafine (LAMISIL) 250 MG tablet Not covered by insurance   Alternative covered are:  Pancolpozole;  omniprazole  Needs prior authorization.  Needs Dr to rewrite the script and have it prior authorized with insurance company.  [employer]   [phone]

## 2013-07-15 NOTE — Telephone Encounter (Signed)
Is it ok for pt to just purchase Prevacid OTC instead of getting the prescription?

## 2015-06-10 ENCOUNTER — Ambulatory Visit: Payer: 59 | Attending: Physician Assistant | Admitting: Physical Therapy

## 2015-06-10 DIAGNOSIS — M6281 Muscle weakness (generalized): Secondary | ICD-10-CM | POA: Diagnosis present

## 2015-06-10 DIAGNOSIS — M5441 Lumbago with sciatica, right side: Secondary | ICD-10-CM | POA: Diagnosis not present
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
## 2015-06-10 NOTE — Patient Instructions (Signed)
Shane Arroyo PT [facility] [address], [address] [place], [place] [address] Phone # [phone] Fax [phone]    

## 2015-06-12 ENCOUNTER — Emergency Department (HOSPITAL_COMMUNITY)
Admission: EM | Admit: 2015-06-12 | Discharge: 2015-06-12 | Disposition: A | Discharge status: Home / Self Care | Payer: 59 | Attending: Emergency Medicine | Admitting: Emergency Medicine

## 2015-06-12 ENCOUNTER — Emergency Department (HOSPITAL_COMMUNITY): Payer: 59

## 2015-06-12 ENCOUNTER — Encounter (HOSPITAL_COMMUNITY): Payer: Self-pay

## 2015-06-12 DIAGNOSIS — Z79899 Other long term (current) drug therapy: Secondary | ICD-10-CM | POA: Diagnosis not present

## 2015-06-12 DIAGNOSIS — K5792 Diverticulitis of intestine, part unspecified, without perforation or abscess without bleeding: Secondary | ICD-10-CM | POA: Insufficient documentation

## 2015-06-12 DIAGNOSIS — Z791 Long term (current) use of non-steroidal anti-inflammatories (NSAID): Secondary | ICD-10-CM | POA: Diagnosis not present

## 2015-06-12 DIAGNOSIS — R1032 Left lower quadrant pain: Secondary | ICD-10-CM | POA: Diagnosis present

## 2015-06-12 DIAGNOSIS — F17200 Nicotine dependence, unspecified, uncomplicated: Secondary | ICD-10-CM | POA: Insufficient documentation

## 2015-06-12 LAB — URINALYSIS, ROUTINE W REFLEX MICROSCOPIC
Bilirubin Urine: NEGATIVE
Glucose, UA: NEGATIVE mg/dL
Hgb urine dipstick: NEGATIVE
Ketones, ur: NEGATIVE mg/dL
Leukocytes, UA: NEGATIVE
Nitrite: NEGATIVE
Protein, ur: NEGATIVE mg/dL
Specific Gravity, Urine: 1.031 — ABNORMAL HIGH (ref 1.005–1.030)
pH: 6.5 (ref 5.0–8.0)

## 2015-06-12 LAB — COMPREHENSIVE METABOLIC PANEL
ALT: 17 U/L (ref 17–63)
AST: 25 U/L (ref 15–41)
Albumin: 4.3 g/dL (ref 3.5–5.0)
Alkaline Phosphatase: 63 U/L (ref 38–126)
Anion gap: 9 (ref 5–15)
BUN: 16 mg/dL (ref 6–20)
CO2: 25 mmol/L (ref 22–32)
Calcium: 9 mg/dL (ref 8.9–10.3)
Chloride: 104 mmol/L (ref 101–111)
Creatinine, Ser: 0.98 mg/dL (ref 0.61–1.24)
GFR calc Af Amer: >60 mL/min (ref >60)
GFR calc non Af Amer: >60 mL/min (ref >60)
Glucose, Bld: 100 mg/dL — ABNORMAL HIGH (ref 65–99)
Potassium: 4.2 mmol/L (ref 3.5–5.1)
Sodium: 138 mmol/L (ref 135–145)
Total Bilirubin: 0.8 mg/dL (ref 0.3–1.2)
Total Protein: 8.1 g/dL (ref 6.5–8.1)

## 2015-06-12 LAB — LIPASE, BLOOD: Lipase: 27 U/L (ref 11–51)

## 2015-06-12 LAB — CBC
HCT: 45 % (ref 39.0–52.0)
Hemoglobin: 15.6 g/dL (ref 13.0–17.0)
MCH: 29.9 pg (ref 26.0–34.0)
MCHC: 34.7 g/dL (ref 30.0–36.0)
MCV: 86.4 fL (ref 78.0–100.0)
Platelets: 189 10*3/uL (ref 150–400)
RBC: 5.21 MIL/uL (ref 4.22–5.81)
RDW: 13.3 % (ref 11.5–15.5)
WBC: 8.6 10*3/uL (ref 4.0–10.5)

## 2015-06-12 MED ORDER — CIPROFLOXACIN IN D5W 400 MG/200ML IV SOLN
400.0000 mg | Freq: Once | INTRAVENOUS | Status: AC
  Administered 2015-06-12: 400 mg via INTRAVENOUS
  Filled 2015-06-12: qty 200

## 2015-06-12 MED ORDER — METRONIDAZOLE 500 MG PO TABS: 500.0000 mg | ORAL_TABLET | Freq: Four times a day (QID) | ORAL | Status: DC

## 2015-06-12 MED ORDER — ONDANSETRON HCL 4 MG/2ML IJ SOLN
4.0000 mg | Freq: Once | INTRAMUSCULAR | Status: AC
  Administered 2015-06-12: 4 mg via INTRAVENOUS
  Filled 2015-06-12: qty 2

## 2015-06-12 MED ORDER — CIPROFLOXACIN HCL 500 MG PO TABS: 500.0000 mg | ORAL_TABLET | Freq: Two times a day (BID) | ORAL | Status: DC

## 2015-06-12 MED ORDER — SODIUM CHLORIDE 0.9 % IV BOLUS (SEPSIS)
1000.0000 mL | Freq: Once | INTRAVENOUS | Status: AC
  Administered 2015-06-12: 1000 mL via INTRAVENOUS

## 2015-06-12 MED ORDER — IOPAMIDOL (ISOVUE-300) INJECTION 61%
100.0000 mL | Freq: Once | INTRAVENOUS | Status: AC | PRN
  Administered 2015-06-12: 100 mL via INTRAVENOUS

## 2015-06-12 MED ORDER — HYDROCODONE-ACETAMINOPHEN 5-325 MG PO TABS: 1.0000 | ORAL_TABLET | Freq: Four times a day (QID) | ORAL | Status: DC | PRN

## 2015-06-12 MED ORDER — METRONIDAZOLE IN NACL 5-0.79 MG/ML-% IV SOLN
500.0000 mg | Freq: Once | INTRAVENOUS | Status: AC
  Administered 2015-06-12: 500 mg via INTRAVENOUS
  Filled 2015-06-12: qty 100

## 2015-06-12 MED ORDER — HYDROMORPHONE HCL 1 MG/ML IJ SOLN
1.0000 mg | Freq: Once | INTRAMUSCULAR | Status: AC
  Administered 2015-06-12: 1 mg via INTRAVENOUS
  Filled 2015-06-12: qty 1

## 2015-06-12 NOTE — Discharge Instructions (Signed)
It was our pleasure to provide your ER care today - we hope that you feel better.  Rest.  Drink plenty of fluids.  Take antibiotics (cipro and flagyl) as prescribed.  Do not drink alcohol when taking these antibiotics.  Take motrin or aleve as need for pain. You may also take hydrocodone as need for pain. No driving when taking hydrocodone. Also, do not take tylenol or acetaminophen containing medication when taking hydrocodone.  Follow up with primary care doctor in the coming week if symptoms fail to improve/resolve.  Return to ER if worse, new symptoms, persistent vomiting, worsening or intractable pain, other concern.  You were given pain medication in the ER - no driving for the next 4 hours.      Diverticulitis Diverticulitis is inflammation or infection of small pouches in your colon that form when you have a condition called diverticulosis. The pouches in your colon are called diverticula. Your colon, or large intestine, is where water is absorbed and stool is formed. Complications of diverticulitis can include:  Bleeding.  Severe infection.  Severe pain.  Perforation of your colon.  Obstruction of your colon. CAUSES  Diverticulitis is caused by bacteria. Diverticulitis happens when stool becomes trapped in diverticula. This allows bacteria to grow in the diverticula, which can lead to inflammation and infection. RISK FACTORS People with diverticulosis are at risk for diverticulitis. Eating a diet that does not include enough fiber from fruits and vegetables may make diverticulitis more likely to develop. SYMPTOMS  Symptoms of diverticulitis may include:  Abdominal pain and tenderness. The pain is normally located on the left side of the abdomen, but may occur in other areas.  Fever and chills.  Bloating.  Cramping.  Nausea.  Vomiting.  Constipation.  Diarrhea.  Blood in your stool. DIAGNOSIS  Your health care provider will ask you about your medical  history and do a physical exam. You may need to have tests done because many medical conditions can cause the same symptoms as diverticulitis. Tests may include:  Blood tests.  Urine tests.  Imaging tests of the abdomen, including X-rays and CT scans. When your condition is under control, your health care provider may recommend that you have a colonoscopy. A colonoscopy can show how severe your diverticula are and whether something else is causing your symptoms. TREATMENT  Most cases of diverticulitis are mild and can be treated at home. Treatment may include:  Taking over-the-counter pain medicines.  Following a clear liquid diet.  Taking antibiotic medicines by mouth for 7-10 days. More severe cases may be treated at a hospital. Treatment may include:  Not eating or drinking.  Taking prescription pain medicine.  Receiving antibiotic medicines through an IV tube.  Receiving fluids and nutrition through an IV tube.  Surgery. HOME CARE INSTRUCTIONS   Follow your health care provider's instructions carefully.  Follow a full liquid diet or other diet as directed by your health care provider. After your symptoms improve, your health care provider may tell you to change your diet. He or she may recommend you eat a high-fiber diet. Fruits and vegetables are good sources of fiber. Fiber makes it easier to pass stool.  Take fiber supplements or probiotics as directed by your health care provider.  Only take medicines as directed by your health care provider.  Keep all your follow-up appointments. SEEK MEDICAL CARE IF:   Your pain does not improve.  You have a hard time eating food.  Your bowel movements do not return  to normal. SEEK IMMEDIATE MEDICAL CARE IF:   Your pain becomes worse.  Your symptoms do not get better.  Your symptoms suddenly get worse.  You have a fever.  You have repeated vomiting.  You have bloody or black, tarry stools. MAKE SURE YOU:    Understand these instructions.  Will watch your condition.  Will get help right away if you are not doing well or get worse.   This information is not intended to replace advice given to you by your health care provider. Make sure you discuss any questions you have with your health care provider.   Document Released: 10/04/2004 Document Revised: 12/30/2012 Document Reviewed: 11/19/2012 Elsevier Interactive Patient Education [employer].

## 2015-06-12 NOTE — ED Notes (Signed)
Pt with lower abdominal pain x 1 week.  Emesis x 1 this morning.  Normal bowels this morning.  No change in urination.  No fever.

## 2015-06-12 NOTE — ED Notes (Signed)
Patient transported to CT 

## 2015-06-12 NOTE — ED Notes (Signed)
MD at bedside. 

## 2015-06-12 NOTE — ED Provider Notes (Signed)
CSN: [id number]     Arrival date & time 06/12/15  1136 History   First MD Initiated Contact with Patient 06/12/15 1242     Chief Complaint  Patient presents with  . Abdominal Pain     (Consider location/radiation/quality/duration/timing/severity/associated sxs/prior Treatment) Patient is a 40 y.o. male presenting with abdominal pain. The history is provided by the patient.  Abdominal Pain Associated symptoms: nausea   Associated symptoms: no chest pain, no constipation, no diarrhea, no dysuria, no fever, no hematuria, no shortness of breath, no sore throat and no vomiting   Patient c/o increasing lower abd pain, esp LLQ for the past week. Pain constant, dull, mod-severe, worse w palpation. Nausea. No vomiting or diarrhea. No constipation.  No dysuria or hematuria.  Denies scrotal or testicular pain.  Denies back or flank pain. No fever or chills.       History reviewed. No pertinent past medical history. History reviewed. No pertinent past surgical history. Family History  Problem Relation Age of Onset  . Diabetes Father   . Cancer Maternal Grandmother   . Alcohol abuse Maternal Grandfather   . Diabetes Paternal Grandmother   . Diabetes Paternal Grandfather    Social History  Substance Use Topics  . Smoking status: Current Every Day Smoker  . Smokeless tobacco: None  . Alcohol Use: No    Review of Systems  Constitutional: Negative for fever.  HENT: Negative for sore throat.   Eyes: Negative for redness.  Respiratory: Negative for shortness of breath.   Cardiovascular: Negative for chest pain.  Gastrointestinal: Positive for nausea and abdominal pain. Negative for vomiting, diarrhea and constipation.  Genitourinary: Negative for dysuria, hematuria, flank pain and testicular pain.  Musculoskeletal: Negative for back pain and neck pain.  Skin: Negative for rash.  Neurological: Negative for headaches.  Hematological: Does not bruise/bleed easily.  Psychiatric/Behavioral:  Negative for confusion.      Allergies  Review of patient's allergies indicates no known allergies.  Home Medications   Prior to Admission medications   Medication Sig Start Date End Date Taking? Authorizing Provider  albuterol (PROVENTIL HFA;VENTOLIN HFA) 108 (90 BASE) MCG/ACT inhaler Inhale 2 puffs into the lungs every 4 (four) hours as needed for wheezing or shortness of breath. Patient not taking: Reported on 06/10/2015 12/28/12   Gertha Calkin, PA-C  lansoprazole (PREVACID) 30 MG capsule Take 1 capsule (30 mg total) by mouth daily at 12 noon. 07/06/13   Roselee Culver, MD  naproxen sodium (ANAPROX) 220 MG tablet Take 220 mg by mouth 2 (two) times daily as needed (pain). Reported on 06/10/2015    Historical Provider, MD  sucralfate (CARAFATE) 1 G tablet 1 tab 1 hr ac and hs Patient not taking: Reported on 06/10/2015 07/06/13   Roselee Culver, MD  terbinafine (LAMISIL) 250 MG tablet Take 1 tablet (250 mg total) by mouth daily. Patient not taking: Reported on 06/10/2015 07/06/13   Roselee Culver, MD   BP 127/101 mmHg  Pulse 76  Temp(Src) 98.1 F (36.7 C) (Oral)  Resp 18  SpO2 100% Physical Exam  Constitutional: He is oriented to person, place, and time. He appears well-developed and well-nourished. No distress.  HENT:  Mouth/Throat: Oropharynx is clear and moist.  Eyes: Conjunctivae are normal. No scleral icterus.  Neck: Neck supple. No tracheal deviation present.  Cardiovascular: Normal rate, regular rhythm, normal heart sounds and intact distal pulses.  Exam reveals no gallop and no friction rub.   No murmur heard. Pulmonary/Chest: Effort normal and  breath sounds normal. No accessory muscle usage. No respiratory distress.  Abdominal: Soft. Bowel sounds are normal. He exhibits no distension and no mass. There is tenderness. There is no rebound and no guarding.  Moderate LLQ tenderness.   Genitourinary:  No scrotal or testicular pain or tenderness.   Musculoskeletal:  Normal range of motion.  Neurological: He is alert and oriented to person, place, and time.  Skin: Skin is warm and dry. He is not diaphoretic.  Psychiatric: He has a normal mood and affect.  Nursing note and vitals reviewed.   ED Course  Procedures (including critical care time) Labs Review   Results for orders placed or performed during the hospital encounter of 06/12/15  Lipase, blood  Result Value Ref Range   Lipase 27 11 - 51 U/L  Comprehensive metabolic panel  Result Value Ref Range   Sodium 138 135 - 145 mmol/L   Potassium 4.2 3.5 - 5.1 mmol/L   Chloride 104 101 - 111 mmol/L   CO2 25 22 - 32 mmol/L   Glucose, Bld 100 (H) 65 - 99 mg/dL   BUN 16 6 - 20 mg/dL   Creatinine, Ser 0.98 0.61 - 1.24 mg/dL   Calcium 9.0 8.9 - 10.3 mg/dL   Total Protein 8.1 6.5 - 8.1 g/dL   Albumin 4.3 3.5 - 5.0 g/dL   AST 25 15 - 41 U/L   ALT 17 17 - 63 U/L   Alkaline Phosphatase 63 38 - 126 U/L   Total Bilirubin 0.8 0.3 - 1.2 mg/dL   GFR calc non Af Amer >60 >60 mL/min   GFR calc Af Amer >60 >60 mL/min   Anion gap 9 5 - 15  CBC  Result Value Ref Range   WBC 8.6 4.0 - 10.5 K/uL   RBC 5.21 4.22 - 5.81 MIL/uL   Hemoglobin 15.6 13.0 - 17.0 g/dL   HCT 45.0 39.0 - 52.0 %   MCV 86.4 78.0 - 100.0 fL   MCH 29.9 26.0 - 34.0 pg   MCHC 34.7 30.0 - 36.0 g/dL   RDW 13.3 11.5 - 15.5 %   Platelets 189 150 - 400 K/uL  Urinalysis, Routine w reflex microscopic  Result Value Ref Range   Color, Urine YELLOW YELLOW   APPearance CLEAR CLEAR   Specific Gravity, Urine 1.031 (H) 1.005 - 1.030   pH 6.5 5.0 - 8.0   Glucose, UA NEGATIVE NEGATIVE mg/dL   Hgb urine dipstick NEGATIVE NEGATIVE   Bilirubin Urine NEGATIVE NEGATIVE   Ketones, ur NEGATIVE NEGATIVE mg/dL   Protein, ur NEGATIVE NEGATIVE mg/dL   Nitrite NEGATIVE NEGATIVE   Leukocytes, UA NEGATIVE NEGATIVE   Ct Abdomen Pelvis W Contrast  06/12/2015  CLINICAL DATA:  Lower abdominal pain for 1 week. Vomiting this morning. EXAM: CT ABDOMEN AND PELVIS  WITH CONTRAST TECHNIQUE: Multidetector CT imaging of the abdomen and pelvis was performed using the standard protocol following bolus administration of intravenous contrast. CONTRAST:  125mL ISOVUE-300 IOPAMIDOL (ISOVUE-300) INJECTION 61% COMPARISON:  None. FINDINGS: Lower chest: Lung bases are clear. No effusions. Heart is normal size. Hepatobiliary: No focal hepatic abnormality. Gallbladder unremarkable. Pancreas: No focal abnormality or ductal dilatation. Spleen: No focal abnormality.  Normal size. Adrenals/Urinary Tract: No adrenal abnormality. No focal renal abnormality. No stones or hydronephrosis. Urinary bladder is unremarkable. Stomach/Bowel: Sigmoid diverticulosis. Mild inflammatory stranding around the sigmoid colon compatible with early active diverticulitis. Appendix is normal. Stomach and small bowel are decompressed. Vascular/Lymphatic: No evidence of aneurysm or adenopathy. Reproductive: No  visible focal abnormality. Other: No free fluid or free air. Musculoskeletal: No acute bony abnormality or focal bone lesion. Degenerative disc disease at L4-5 with vacuum disc. IMPRESSION: Sigmoid diverticulosis. Slight inflammatory stranding around the sigmoid colon compatible with early active diverticulitis. Electronically Signed   By: Rolm Baptise M.D.   On: 06/12/2015 13:50      I have personally reviewed and evaluated these images and lab results as part of my medical decision-making.    MDM   Iv ns. Labs.  Reviewed nursing notes and prior charts for additional history.   Dilaudid 1 mg iv.  zofran iv.   CT.  Reviewed nursing notes and prior charts for additional history.   cipro and flagyl iv.   Recheck feels much improved. No nv. Pain improved.      Lajean Saver, MD 06/12/15 [phone]

## 2015-06-13 ENCOUNTER — Ambulatory Visit: Payer: 59 | Admitting: Physical Therapy

## 2015-06-13 ENCOUNTER — Encounter: Payer: Self-pay | Admitting: Physical Therapy

## 2015-06-13 DIAGNOSIS — M5441 Lumbago with sciatica, right side: Secondary | ICD-10-CM | POA: Diagnosis not present

## 2015-06-13 DIAGNOSIS — M6281 Muscle weakness (generalized): Secondary | ICD-10-CM

## 2015-06-13 NOTE — Patient Instructions (Addendum)
Lower abdominal/core stability exercises  1. Practice your breathing technique: Inhale through your nose expanding your belly and rib cage. Try not to breathe into your chest. Exhale slowly and gradually out your mouth feeling a sense of softness to your body. Practice multiple times. This can be performed unlimited.  2. Finding the lower abdominals. Laying on your back with the knees bent, place your fingers just below your belly button. Using your breathing technique from above, on your exhale gently pull the belly button away from your fingertips without tensing any other muscles. Practice this 5x. Next, as you exhale, draw belly button inwards and hold onto it...then feel as if you are pulling that muscle across your pelvis like you are tightening a belt. This can be hard to do at first so be patient and practice. Do 5-10 reps 1-3 x day. Always recognize quality over quantity; if your abdominal muscles become tired you will notice you may tighten/contract other muscles. This is the time to take a break.   Practice this first laying on your back, then in sitting, progressing to standing and finally adding it to all your daily movements.   3. Finding your pelvic floor. Using the breathing technique above, when your exhale, this time draw your pelvic floor muscles up as if you were attempting to stop the flow of urination. Be careful NOT to tense any other muscles. This can be hard, BE PATIENT. Try to hold up to 10 seconds repeating 10x. Try 2x a day. Once you feel you are doing this well, add this contraction to exercise #2. First contracting your pelvic floor followed by lower abdominals.  4. Adding leg movements. Add the following leg movements to challenge your ability to keep your core stable:  1. Single leg drop outs: Laying on your back with knees bent feet flat. Inhale,  dropping one knee outward KEEPING YOUR PELVIS STILL. Exhale as you bring the leg back, simultaneously performing your lower  abdominal contraction. Do 5-10 on each leg.  2. Marching: While keeping your pelvis still, lift the right foot a few inches, put it down then lift left foot. This will mimic a march. Start slow to establish control. Once you have control you may speed it up. Do 10-20x. You MUST keep your lower abdominlas contracted while you march. Breathe naturally   3. Single leg slides: Inhale while you slowly slide one leg out keeping your pelvis still. Only slide your leg as far as you can keep your pelvis still. Exhale as you bring the leg back to the start, contracting the lower abdominals as you do that. Keep your upper body relaxed. Do 5-10 on each side. Lower abdominal/core stability exercises  1. Practice your breathing technique: Inhale through your nose expanding your belly and rib cage. Try not to breathe into your chest. Exhale slowly and gradually out your mouth feeling a sense of softness to your body. Practice multiple times. This can be performed unlimited.  2. Finding the lower abdominals. Laying on your back with the knees bent, place your fingers just below your belly button. Using your breathing technique from above, on your exhale gently pull the belly button away from your fingertips without tensing any other muscles. Practice this 5x. Next, as you exhale, draw belly button inwards and hold onto it...then feel as if you are pulling that muscle across your pelvis like you are tightening a belt. This can be hard to do at first so be patient and practice. Do 5-10   5-10 reps 1-3 x day. Always recognize quality over quantity; if your abdominal muscles become tired you will notice you may tighten/contract other muscles. This is the time to take a break.   Practice this first laying on your back, then in sitting, progressing to standing and finally adding it to all your daily movements.   3. Finding your pelvic floor. Using the breathing technique above, when your exhale, this time draw your pelvic floor  muscles up as if you were attempting to stop the flow of urination. Be careful NOT to tense any other muscles. This can be hard, BE PATIENT. Try to hold up to 10 seconds repeating 10x. Try 2x a day. Once you feel you are doing this well, add this contraction to exercise #2. First contracting your pelvic floor followed by lower abdominals.   4. Adding leg movements. Add the following leg movements to challenge your ability to keep your core stable:  1. Single leg drop outs: Laying on your back with knees bent feet flat. Inhale,  dropping one knee outward KEEPING YOUR PELVIS STILL. Exhale as you bring the leg back, simultaneously performing your lower abdominal contraction. Do 5-10 on each leg.   2. Marching: While keeping your pelvis still, lift the right foot a few inches, put it down then lift left foot. This will mimic a march. Start slow to establish control. Once you have control you may speed it up. Do 10-20x. You MUST keep your lower abdominlas contracted while you march. Breathe naturally    3. Single leg slides: Inhale while you slowly slide one leg out keeping your pelvis still. Only slide your leg as far as you can keep your pelvis still. Exhale as you bring the leg back to the start, contracting the lower abdominals as you do that. Keep your upper body relaxed. Do 5-10 on each side.    [facility] [address], [place], [place] [address] Phone # [phone] Fax [phone]

## 2015-06-13 NOTE — Therapy (Signed)
[facility] [address] [place], [place], [address] Phone: [phone]   Fax:  [phone]  Physical Therapy Treatment  Patient Details  Name: Shane Arroyo MRN: [id number] Date of Birth: 1975-07-18 Referring Provider: Corine Shelter PA  Encounter Date: 06/13/2015      PT End of Session - 06/13/15 1619    Visit Number 2   Date for PT Re-Evaluation 08/05/15   PT Start Time [id number]   PT Stop Time 1658   PT Time Calculation (min) 44 min   Activity Tolerance Patient tolerated treatment well   Behavior During Therapy [facility] for tasks assessed/performed      History reviewed. No pertinent past medical history.  History reviewed. No pertinent past surgical history.  There were no vitals filed for this visit.      Subjective Assessment - 06/13/15 1616    Subjective My pain in low back is 1/10, but I'm scared if I workout my back will start to hurt, no radiating pain today. Taking a break from the gym until I lear what to do   Limitations Sitting   How long can you sit comfortably? long periods of time but will make it sore    How long can you walk comfortably? as long as I want to   Diagnostic tests none   Patient Stated Goals know what's going on and how to take care of it   Currently in Pain? Yes   Pain Score 1    Pain Location Back   Pain Orientation Right;Left   Pain Descriptors / Indicators Aching   Pain Type Acute pain   Aggravating Factors  working out at the gym, sleep on the couch   Pain Relieving Factors walking, Ephraim Hamburger, lying on the floor flat   Multiple Pain Sites No                         [facility] Adult PT Treatment/Exercise - 06/13/15 0001    Posture/Postural Control   Posture/Postural Control Postural limitations   Postural Limitations Decreased lumbar lordosis   Exercises   Exercises Lumbar;Knee/Hip   Lumbar Exercises: Stretches   Press Ups --  Lynden Oxford extension x 10 in  prone and standing   Lumbar Exercises: Aerobic   UBE (Upper Arm Bike) L2 x 6 (3/3) sitting on green physioball   Lumbar Exercises: Standing   Other Standing Lumbar Exercises 30# rows on lat machine   Lumbar Exercises: Supine   Other Supine Lumbar Exercises Educated and practiced TA activation with leg drop, marching and leg slide   Lumbar Exercises: Quadruped   Plank 3x 20 sec  needs tactile cues to correct pelvis                PT Education - 06/13/15 1653    Education provided Yes   Education Details TA activation; finding your pelvic floor and practice single leg drop, marching, single leg slides   Person(s) Educated Patient   Methods Explanation;Demonstration;Handout   Comprehension Verbalized understanding;Returned demonstration          PT Short Term Goals - 06/13/15 1637    PT SHORT TERM GOAL #1   Title The patient will demonstrate sitting posture correction, basic body mechanics to promote healing  07/08/15   Time 4   Period Weeks   Status On-going   PT SHORT TERM GOAL #2   Title The patient's pain will be centralized > 50% of  the time   Time 4   Period Weeks   Status On-going   PT SHORT TERM GOAL #3   Title The patient will report a 40% improvement in pain intensity with usual ADLs   Time 4   Period Weeks   Status On-going           PT Long Term Goals - 06/10/15 1301    PT LONG TERM GOAL #1   Title The patient will be independent in safe self progression of home ex program and gym program  08/05/15   Time 8   Period Weeks   Status New   PT LONG TERM GOAL #2   Title The patient will have 4+/5 core/trunk strength needed for higher level activities including lifting   Time 8   Period Weeks   Status New   PT LONG TERM GOAL #3   Title The patient will report a 75% improvement in overall pain with riding his motorcycle, going to the gym and other ADLs   Time 8   Period Weeks   Status New   PT LONG TERM GOAL #4   Title Full and painless lumbar  AROM needed for ADLs   Time 8   Period Weeks   Status New   PT LONG TERM GOAL #5   Title FOTO functional outcome score improved from 22% limitation to 8% indicating improved function with less pain   Time 8   Period Weeks   Status New               Plan - 06/13/15 1620    Clinical Impression Statement Pt tolerated activities well, he is educated to avoid abdominal work as sitt ups's with weights. Practiced planks and pt tolerated well. Focused on TA awarness and TA activation. Pt will continue to benefit from skilled PT to lear self managment strategies including appropriate gym exercises and how to prevent future occurences.    Rehab Potential Good   PT Frequency 2x / week   PT Duration 8 weeks   PT Treatment/Interventions ADLs/Self Care Home Management;Cryotherapy;Electrical Stimulation;Moist Heat;Therapeutic exercise;Therapeutic activities;Functional mobility training;Ultrasound;Patient/family education;Manual techniques;Taping;Dry needling   PT Next Visit Plan continue [facility] extension, TA activation and gym exercises with proper bodymechanics;  modalities as needed      Patient will benefit from skilled therapeutic intervention in order to improve the following deficits and impairments:  Pain, Decreased activity tolerance, Decreased range of motion, Decreased strength, Improper body mechanics, Postural dysfunction  Visit Diagnosis: Bilateral low back pain with right-sided sciatica  Muscle weakness (generalized)     Problem List There are no active problems to display for this patient.   NAUMANN-HOUEGNIFIO,Gohan Collister PTA 06/13/2015, 5:02 PM  [facility] [address], [place], [place], [address] Phone: [phone]   Fax:  [phone]  Name: Shane Arroyo MRN: [id number] Date of Birth: 31-Jan-1975

## 2015-06-15 ENCOUNTER — Ambulatory Visit: Payer: 59 | Admitting: Physical Therapy

## 2015-06-20 ENCOUNTER — Telehealth: Payer: Self-pay | Admitting: Physical Therapy

## 2015-06-20 ENCOUNTER — Ambulatory Visit: Payer: 59 | Admitting: Physical Therapy

## 2015-06-20 NOTE — Telephone Encounter (Signed)
Patient missed appointment on Monday June 12th at 16:15, PTA called, - patient informed PTA that he had an MD appointment. PTA informed patient about his responsibility to inform us about his time conflict and to cancel, he verbalized understanding. He is aware of his appointment on Wednesday and verbally confirmed he will be present at PT clinic. Jeanine Luz Houegnifio, PTA

## 2015-06-22 ENCOUNTER — Encounter: Payer: Self-pay | Admitting: Physical Therapy

## 2015-06-22 ENCOUNTER — Ambulatory Visit: Payer: 59 | Admitting: Physical Therapy

## 2015-06-22 DIAGNOSIS — M6281 Muscle weakness (generalized): Secondary | ICD-10-CM

## 2015-06-22 DIAGNOSIS — M5441 Lumbago with sciatica, right side: Secondary | ICD-10-CM | POA: Diagnosis not present

## 2015-06-22 NOTE — Therapy (Signed)
[facility] [address] [place], [place], [address] Phone: [phone]   Fax:  [phone]  Physical Therapy Treatment  Patient Details  Name: Shane Arroyo MRN: [id number] Date of Birth: 09/18/1975 Referring Provider: Corine Shelter PA  Encounter Date: 06/22/2015      PT End of Session - 06/22/15 1554    Visit Number 3   Date for PT Re-Evaluation 08/05/15   PT Start Time 1520   PT Stop Time 1615   PT Time Calculation (min) 55 min   Activity Tolerance Patient tolerated treatment well   Behavior During Therapy [facility] for tasks assessed/performed      History reviewed. No pertinent past medical history.  History reviewed. No pertinent past surgical history.  There were no vitals filed for this visit.      Subjective Assessment - 06/22/15 1529    Subjective I'm doing pretty good.    Currently in Pain? No/denies            [facility] PT Assessment - 06/22/15 0001    AROM   Lumbar Flexion 50   Lumbar Extension 35                     [facility] Adult PT Treatment/Exercise - 06/22/15 0001    Lumbar Exercises: Stretches   Single Knee to Chest Stretch 3 reps;20 seconds   Press Ups --  10x standing   Lumbar Exercises: Aerobic   UBE (Upper Arm Bike) L2 x 6 (3/3) sitting on green physioball   Lumbar Exercises: Standing   Other Standing Lumbar Exercises 30# 2 x10   VC to engage core   Other Standing Lumbar Exercises #35 backwards walking with cable  Emphasis on core: Cable forward planks 15# 2x10 VC on core.   Lumbar Exercises: Supine   Other Supine Lumbar Exercises Decopmression position in hooklying:  diaphragmatic breathing and knee to chest stretch   Moist Heat Therapy   Number Minutes Moist Heat 15 Minutes   Moist Heat Location Lumbar Spine  post tx in hooklying                  PT Short Term Goals - 06/22/15 1544    PT SHORT TERM GOAL #2   Title The patient's pain will be  centralized > 50% of the time   Time 4   Period Weeks   Status Achieved   PT SHORT TERM GOAL #3   Title The patient will report a 40% improvement in pain intensity with usual ADLs   Time 4   Period Weeks   Status Achieved  75%           PT Long Term Goals - 06/22/15 1548    PT LONG TERM GOAL #3   Title The patient will report a 75% improvement in overall pain with riding his motorcycle, going to the gym and other ADLs   Time 8   Period Weeks   Status Achieved   PT LONG TERM GOAL #4   Title Full and painless lumbar AROM needed for ADLs   Time 8   Period Weeks   Status Partially Met  Sore at end range but WNL               Plan - 06/22/15 1525    Clinical Impression Statement Pt reports he has not had leg pain for over a week and presented today with no LBP. He performed challenging core specific exercises  vey well, as well as flexibility exercises for his back. He did report not doing home stretches. Lumbar ROM was improved today.    Rehab Potential Good   PT Frequency 2x / week   PT Duration 8 weeks   PT Treatment/Interventions ADLs/Self Care Home Management;Cryotherapy;Electrical Stimulation;Moist Heat;Therapeutic exercise;Therapeutic activities;Functional mobility training;Ultrasound;Patient/family education;Manual techniques;Taping;Dry needling   PT Next Visit Plan continue [facility] extension, TA activation and gym exercises with proper bodymechanics;  modalities as needed   Consulted and Agree with Plan of Care Patient      Patient will benefit from skilled therapeutic intervention in order to improve the following deficits and impairments:  Pain, Decreased activity tolerance, Decreased range of motion, Decreased strength, Improper body mechanics, Postural dysfunction  Visit Diagnosis: Bilateral low back pain with right-sided sciatica  Muscle weakness (generalized)     Problem List There are no active problems to display for this  patient.   Jeremi Losito , PTA  06/22/2015, 3:58 PM  [facility] [address], [place], [place], [address] Phone: [phone]   Fax:  [phone]  Name: Shane Arroyo MRN: [id number] Date of Birth: 11-Aug-1975

## 2015-06-27 ENCOUNTER — Ambulatory Visit: Payer: 59 | Admitting: Physical Therapy

## 2015-06-27 DIAGNOSIS — M6281 Muscle weakness (generalized): Secondary | ICD-10-CM

## 2015-06-27 DIAGNOSIS — M5441 Lumbago with sciatica, right side: Secondary | ICD-10-CM | POA: Diagnosis not present

## 2015-06-27 NOTE — Therapy (Signed)
[facility] [address], [place], [place], [address] Phone: [phone]   Fax:  [phone]  Physical Therapy Treatment  Patient Details  Name: Shane Arroyo MRN: [id number] Date of Birth: [birth year] Referring Provider: Corine Shelter PA  Encounter Date: 06/27/2015      PT End of Session - 06/27/15 1644    Visit Number 4   Date for PT Re-Evaluation 08/05/15   PT Start Time 1614   PT Stop Time 1650   PT Time Calculation (min) 36 min   Activity Tolerance Patient tolerated treatment well   Behavior During Therapy [facility] for tasks assessed/performed      No past medical history on file.  No past surgical history on file.  There were no vitals filed for this visit.      Subjective Assessment - 06/27/15 1643    Subjective I'm doing pretty good.    Limitations Sitting   How long can you sit comfortably? long periods of time but will make it sore    How long can you walk comfortably? as long as I want to   Diagnostic tests none   Patient Stated Goals know what's going on and how to take care of it   Currently in Pain? No/denies                         [facility] Adult PT Treatment/Exercise - 06/27/15 0001    Exercises   Exercises Lumbar;Knee/Hip   Lumbar Exercises: Stretches   Active Hamstring Stretch 3 reps;20 seconds  using strap, each leg in supine   Press Ups --  10 x standing   Quad Stretch 3 reps;20 seconds  by PTA each leg x 3, and both   Lumbar Exercises: Aerobic   UBE (Upper Arm Bike) L2 x 6 (3/3) sitting on green physioball   Lumbar Exercises: Standing   Other Standing Lumbar Exercises 30# 2 x10   vc for core activation   Other Standing Lumbar Exercises #35 backwards walking with cable  vc for core activation   Lumbar Exercises: Supine   Bridge 10 reps   Lumbar Exercises: Prone   Other Prone Lumbar Exercises --  plank position x 4 with 20 sec hold   Moist Heat Therapy   Number Minutes Moist Heat --  declined heat today, to hot outside                  PT Short Term Goals - 06/27/15 1655    PT SHORT TERM GOAL #1   Title The patient will demonstrate sitting posture correction, basic body mechanics to promote healing  07/08/15   Time 4   Period Weeks   Status On-going   PT SHORT TERM GOAL #2   Title The patient's pain will be centralized > 50% of the time   Time 4   Period Weeks   Status Achieved   PT SHORT TERM GOAL #3   Title The patient will report a 40% improvement in pain intensity with usual ADLs   Time 4   Period Weeks   Status Achieved           PT Long Term Goals - 06/27/15 1656    PT LONG TERM GOAL #1   Title The patient will be independent in safe self progression of home ex program and gym program  08/05/15   Time 8   Period Weeks   Status On-going   PT  LONG TERM GOAL #2   Title The patient will have 4+/5 core/trunk strength needed for higher level activities including lifting   Time 8   Period Weeks   Status On-going   PT LONG TERM GOAL #3   Title The patient will report a 75% improvement in overall pain with riding his motorcycle, going to the gym and other ADLs   Time 8   Period Weeks   Status Achieved   PT LONG TERM GOAL #4   Title Full and painless lumbar AROM needed for ADLs   Time 8   Period Weeks   Status Partially Met   PT LONG TERM GOAL #5   Title FOTO functional outcome score improved from 22% limitation to 8% indicating improved function with less pain   Time 8   Period Weeks   Status On-going               Plan - 06/27/15 1653    Clinical Impression Statement Pt has no complain of pain, but needs to improve his posture and learn activation of abdominals. Pt will continue to benefit from skilled PT to improve posture awarness, TA activation overall strength.    Rehab Potential Good   PT Frequency 2x / week   PT Duration 8 weeks   PT Treatment/Interventions ADLs/Self Care Home  Management;Cryotherapy;Electrical Stimulation;Moist Heat;Therapeutic exercise;Therapeutic activities;Functional mobility training;Ultrasound;Patient/family education;Manual techniques;Taping;Dry needling   PT Next Visit Plan continue [facility] extension, TA activation and gym exercises with proper bodymechanics;  modalities as needed   Consulted and Agree with Plan of Care Patient      Patient will benefit from skilled therapeutic intervention in order to improve the following deficits and impairments:  Pain, Decreased activity tolerance, Decreased range of motion, Decreased strength, Improper body mechanics, Postural dysfunction  Visit Diagnosis: Bilateral low back pain with right-sided sciatica  Muscle weakness (generalized)     Problem List There are no active problems to display for this patient.   NAUMANN-HOUEGNIFIO,Donyell Ding PTA 06/27/2015, 5:05 PM  [facility] [address] [place], [place], [address] Phone: [phone]   Fax:  [phone]  Name: Shane Arroyo MRN: [id number] Date of Birth: 25-Jan-1975

## 2015-07-04 ENCOUNTER — Ambulatory Visit: Payer: 59 | Admitting: Physical Therapy

## 2015-07-05 ENCOUNTER — Ambulatory Visit: Payer: 59 | Admitting: Physical Therapy

## 2015-07-05 DIAGNOSIS — M5441 Lumbago with sciatica, right side: Secondary | ICD-10-CM

## 2015-07-05 DIAGNOSIS — M6281 Muscle weakness (generalized): Secondary | ICD-10-CM

## 2015-07-05 NOTE — Therapy (Addendum)
[facility] [address], [place], [place], [address] Phone: [phone]   Fax:  [phone]  Physical Therapy Treatment/Discharge Summary  Patient Details  Name: Shane Arroyo MRN: [id number] Date of Birth: 1975/02/17 Referring Provider: Corine Shelter PA  Encounter Date: 07/05/2015      PT End of Session - 07/05/15 1409    Visit Number 5   Date for PT Re-Evaluation 08/05/15   PT Start Time 9924   PT Stop Time 1445   PT Time Calculation (min) 43 min   Activity Tolerance Patient tolerated treatment well      No past medical history on file.  No past surgical history on file.  There were no vitals filed for this visit.      Subjective Assessment - 07/05/15 1401    Subjective Patient demonstrates his much improved posture in standing.  Slight pain today.  I slept on the couch last night.  I haven't been back to the gym, it's a boot camp style 45 min.     Currently in Pain? Yes   Pain Score 2    Pain Location Back   Pain Type Acute pain   Aggravating Factors  sleeping on the couch                         [facility] Adult PT Treatment/Exercise - 07/05/15 0001    Lumbar Exercises: Stretches   Active Hamstring Stretch 3 reps;20 seconds  using strap, each leg in supine   Press Ups 5 reps   Prone Mid Back Stretch 3 reps   Lumbar Exercises: Aerobic   UBE (Upper Arm Bike) L2 x 6 (3/3) sitting on green physioball   Lumbar Exercises: Standing   Other Standing Lumbar Exercises 30# 3 x10 rows  vc for core activation   Other Standing Lumbar Exercises 4# plyo ball chops and shoulder to shoulder   Lumbar Exercises: Supine   Bent Knee Raise 5 reps   Isometric Hip Flexion 5 reps   Lumbar Exercises: Quadruped   Opposite Arm/Leg Raise Right arm/Left leg;Left arm/Right leg;10 reps   Knee/Hip Exercises: Aerobic   Elliptical Resist 1 Incline 3 5 min                PT Education - 07/05/15 1428     Education provided Yes   Education Details bird dogs, prayer stretch   Person(s) Educated Patient   Methods Explanation;Demonstration;Handout   Comprehension Verbalized understanding          PT Short Term Goals - 07/05/15 1440    PT SHORT TERM GOAL #1   Title The patient will demonstrate sitting posture correction, basic body mechanics to promote healing  07/08/15   Status Achieved   PT SHORT TERM GOAL #2   Title The patient's pain will be centralized > 50% of the time   Status Achieved   PT SHORT TERM GOAL #3   Title The patient will report a 40% improvement in pain intensity with usual ADLs   Status Achieved           PT Long Term Goals - 07/05/15 1440    PT LONG TERM GOAL #1   Title The patient will be independent in safe self progression of home ex program and gym program  08/05/15   Time 8   Period Weeks   Status On-going   PT LONG TERM GOAL #2   Title The patient will have  4+/5 core/trunk strength needed for higher level activities including lifting   Time 8   Period Weeks   Status On-going   PT LONG TERM GOAL #3   Title The patient will report a 75% improvement in overall pain with riding his motorcycle, going to the gym and other ADLs   Status Achieved   PT LONG TERM GOAL #4   Title Full and painless lumbar AROM needed for ADLs   Time 8   Period Weeks   Status Partially Met   PT LONG TERM GOAL #5   Title FOTO functional outcome score improved from 22% limitation to 8% indicating improved function with less pain   Time 8   Period Weeks   Status On-going               Plan - 07/05/15 1408    Clinical Impression Statement The 1-2/10 pain patient arrives with is resolved after using the UBE arm bike.  The patient is able to participate in a moderate level core strengthening ex program without pain exacerbation.   Therapist closely monitoring for proper technique with all and pain response.   He expresses readiness for discharge from PT next visit in  order to return to his gym program.  Check progress toward goals and FOTO next visit.  If majority met, will discharge.     PT Next Visit Plan recheck progress toward goals; FOTO;  discuss gym progression, core and body mechanics;  discharge per patient request      Patient will benefit from skilled therapeutic intervention in order to improve the following deficits and impairments:     Visit Diagnosis: Bilateral low back pain with right-sided sciatica  Muscle weakness (generalized)    PHYSICAL THERAPY DISCHARGE SUMMARY  Visits from Start of Care: 5  Current functional level related to goals / functional outcomes: The patient improved well.  See clinical impressions above.  He no-showed for last scheduled appt in which discharge was planned.   Remaining deficits: As above   Education / Equipment: HEP  Plan: Patient agrees to discharge.  Patient goals were partially met. Patient is being discharged due to not returning since the last visit.  ?????        Problem List There are no active problems to display for this patient. Ruben Im, PT 07/05/2015 2:42 PM Phone: [phone] Fax: [phone]  Alvera Singh 07/05/2015, 2:41 PM  [facility] [address] [place], [place], [address] Phone: [phone]   Fax:  [phone]  Name: Shane Arroyo MRN: [id number] Date of Birth: 28-Dec-1975

## 2015-07-05 NOTE — Patient Instructions (Signed)
Angry Cat Stretch  Tuck chin and tighten stomach, arching back. Repeat __5-10__ times per set.  Do _1-2___ sessions per day. Child Pose   Sitting on knees, fold body over legs and relax head and arms on floor. Hold for _20___ breaths.  Do 3 reps.  1-2 times a day.  Copyright  VHI. All rights reserved.  Side Waist Stretch from Child's Pose  From child's pose, walk hands to left. Reach right hand out on diagonal. Reach hips back toward heels making a C with torso. Breathe into right side waist. Hold for _20___ breaths. Repeat _3___ times each side.  1-2 times a day.  Copyright  VHI. All rights reserved.    Isometric Hold (Quadruped)   On hands and knees, slowly inhale, and then exhale. Pull navel toward spine and Hold for ___ seconds. Continue to breathe in and out during hold. Rest for __3_ seconds. Repeat _5__ times. Do _1__ times a day.   Copyright  VHI. All rights reserved.  Bracing With Arm Raise (Quadruped)   On hands and knees find neutral spine. Tighten pelvic floor and abdominals and hold. Alternately lift arm to shoulder level. Repeat _5__ times. Do __1_ times a day.   Copyright  VHI. All rights reserved.  Bracing With Leg Raise (Quadruped)   On hands and knees find neutral spine. Tighten pelvic floor and abdominals and hold. Alternating legs, straighten and lift to hip level. Repeat _5__ times. Do _1__ times a day.   Copyright  VHI. All rights reserved.  Bracing With Arm / Leg Raise (Quadruped)   On hands and knees find neutral spine. Tighten pelvic floor and abdominals and hold. Alternating, lift arm to shoulder level and opposite leg to hip level. Repeat _5__ times. Do __1_ times a day.   Copyright  VHI. All rights reserved.     Ruben Im PT [facility] [address], [place], [place] [address] Phone # [phone] Fax [phone]

## 2015-07-06 ENCOUNTER — Ambulatory Visit: Payer: 59 | Admitting: Physical Therapy

## 2015-09-22 ENCOUNTER — Other Ambulatory Visit: Payer: Self-pay | Admitting: Physical Medicine and Rehabilitation

## 2015-09-22 DIAGNOSIS — M545 Low back pain: Secondary | ICD-10-CM

## 2015-09-27 ENCOUNTER — Ambulatory Visit
Admission: RE | Admit: 2015-09-27 | Discharge: 2015-09-27 | Disposition: A | Discharge status: Home / Self Care | Payer: 59 | Source: Ambulatory Visit | Attending: Physical Medicine and Rehabilitation | Admitting: Physical Medicine and Rehabilitation

## 2015-09-27 DIAGNOSIS — M545 Low back pain: Secondary | ICD-10-CM

## 2015-10-12 ENCOUNTER — Ambulatory Visit (INDEPENDENT_AMBULATORY_CARE_PROVIDER_SITE_OTHER): Payer: Self-pay | Admitting: Physician Assistant

## 2015-10-12 VITALS — BP 130/90 | HR 93 | Temp 98.4°F | Resp 18 | Ht 66.5 in | Wt 243.6 lb

## 2015-10-12 DIAGNOSIS — R030 Elevated blood-pressure reading, without diagnosis of hypertension: Secondary | ICD-10-CM

## 2015-10-12 DIAGNOSIS — Z202 Contact with and (suspected) exposure to infections with a predominantly sexual mode of transmission: Secondary | ICD-10-CM

## 2015-10-12 MED ORDER — AZITHROMYCIN 250 MG PO TABS: ORAL_TABLET | ORAL | 0 refills | Status: DC

## 2015-10-12 MED ORDER — CEFTRIAXONE SODIUM 250 MG IJ SOLR
250.0000 mg | Freq: Once | INTRAMUSCULAR | Status: AC
  Administered 2015-10-12: 250 mg via INTRAMUSCULAR

## 2015-10-12 NOTE — Progress Notes (Addendum)
  By signing my name below, I, Raven Small, attest that this documentation has been prepared under the direction and in the presence of Philis Fendt, PA-C.  Electronically Signed: Thea Alken, ED Scribe. 10/12/2015. 3:00 PM.  10/12/2015 3:00 PM   DOB: 1975-08-31 / MRN: [id number]  SUBJECTIVE:  Shane Arroyo is a 40 y.o. male presenting for STD exposure. Pt states his girlfriend was diagnosed with chlamydia. He presents with her labs which states she was negative for trichomoniasis, positive for chlamydia, and negative for gonorrhea on 10/09/2015. Pt is asymptomatic. He denies penile discharge, dysuria, testicular pain, nausea and lower abdominal pain.     He has No Known Allergies.   He  has no past medical history on file.    He  reports that he has been smoking.  He does not have any smokeless tobacco history on file. He reports that he does not drink alcohol or use drugs. He  has no sexual activity history on file. The patient  has no past surgical history on file.  His family history includes Alcohol abuse in his maternal grandfather; Cancer in his maternal grandmother; Diabetes in his father, paternal grandfather, and paternal grandmother.  Review of Systems  Constitutional: Negative for fever.  Gastrointestinal: Negative for abdominal pain and nausea.  Genitourinary: Negative for dysuria.  Skin: Negative for itching and rash.    The problem list and medications were reviewed and updated by myself where necessary and exist elsewhere in the encounter.   OBJECTIVE:  BP 130/90   Pulse 93   Temp 98.4 F (36.9 C) (Oral)   Resp 18   Ht 5' 6.5" (1.689 m)   Wt 243 lb 9.6 oz (110.5 kg)   SpO2 100%   BMI 38.73 kg/m   Physical Exam  Constitutional: He is oriented to person, place, and time. He appears well-developed.  Cardiovascular: Normal rate.   Pulmonary/Chest: Effort normal.  Neurological: He is alert and oriented to person, place, and time. No cranial nerve deficit.    Skin: Skin is warm and dry.  Psychiatric: He has a normal mood and affect.    ASSESSMENT AND PLAN  Exposure to chlamydia - Plan: cefTRIAXone (ROCEPHIN) injection 250 mg.    Pt with confirmed exposure to chlamydia will treat today for gonorrhea, chlamydia HIV and trichomoniasis.   The patient is advised to call or return to clinic if he does not see an improvement in symptoms, or to seek the care of the closest emergency department if he worsens with the above plan.   This note was scribed in my presence and I performed the services described in the this documentation.   Philis Fendt, MHS, PA-C [facility] 10/12/2015 3:00 PM

## 2015-10-12 NOTE — Patient Instructions (Signed)
     IF you received an x-ray today, you will receive an invoice from [facility]. Please contact [facility] at [phone] with questions or concerns regarding your invoice.   IF you received labwork today, you will receive an invoice from [facility]. Please contact [facility] at [phone] with questions or concerns regarding your invoice.   Our billing staff will not be able to assist you with questions regarding bills from these companies.  You will be contacted with the lab results as soon as they are available. The fastest way to get your results is to activate your My Chart account. Instructions are located on the last page of this paperwork. If you have not heard from us regarding the results in 2 weeks, please contact this office.      

## 2015-10-13 LAB — HIV ANTIBODY (ROUTINE TESTING W REFLEX): HIV 1&2 Ab, 4th Generation: NONREACTIVE

## 2015-10-13 LAB — GC/CHLAMYDIA PROBE AMP
CT Probe RNA: NOT DETECTED
GC Probe RNA: NOT DETECTED

## 2015-10-13 LAB — TRICHOMONAS VAGINALIS RNA, QL,MALES: Trichomonas vaginalis RNA: NOT DETECTED

## 2015-10-13 LAB — RPR

## 2015-10-14 ENCOUNTER — Encounter: Payer: Self-pay | Admitting: *Deleted

## 2015-10-25 ENCOUNTER — Other Ambulatory Visit: Payer: Self-pay | Admitting: Orthopedic Surgery

## 2015-11-02 ENCOUNTER — Encounter (HOSPITAL_COMMUNITY): Payer: Self-pay | Admitting: *Deleted

## 2015-11-02 NOTE — Progress Notes (Signed)
Spoke with pt for pre-op call. He denies any cardiac history, chest pain or sob. Does admit to using cocaine 2 days ago and marijuana last pm. Instructed pt not to smoke cigarettes, use cocaine or marijuana as of now until after surgery. Pt voiced understanding.

## 2015-11-02 NOTE — H&P (Signed)
     PREOPERATIVE H&P  Chief Complaint: R leg pain  HPI: Shane Arroyo is a 40 y.o. male who presents with ongoing pain in the right leg  MRI reveals a right L5/S1 disc protrusion, displacing the right S1 nerve  Patient has failed multiple forms of conservative care and continues to have pain (see office notes for additional details regarding the patient's full course of treatment)  Past Medical History:  Diagnosis Date  . GERD (gastroesophageal reflux disease)    Past Surgical History:  Procedure Laterality Date  . NO PAST SURGERIES    . WISDOM TOOTH EXTRACTION     2 taken out   Social History   Social History  . Marital status: Single    Spouse name: N/A  . Number of children: N/A  . Years of education: N/A   Social History Main Topics  . Smoking status: Current Every Day Smoker    Packs/day: 0.50    Types: Cigarettes  . Smokeless tobacco: Never Used  . Alcohol use Yes     Comment: mainly on weekends  . Drug use:     Types: Marijuana, Cocaine     Comment: last cocaine use 10/31/15, last marijuana use 11/01/15  . Sexual activity: Not Asked   Other Topics Concern  . None   Social History Narrative  . None   Family History  Problem Relation Age of Onset  . Diabetes Father   . Cancer Maternal Grandmother   . Alcohol abuse Maternal Grandfather   . Diabetes Paternal Grandmother   . Diabetes Paternal Grandfather    No Known Allergies Prior to Admission medications   Medication Sig Start Date End Date Taking? Authorizing Provider  HYDROcodone-acetaminophen (NORCO/VICODIN) 5-325 MG tablet Take 1 tablet by mouth every 8 (eight) hours as needed for moderate pain.  10/25/15  Yes Historical Provider, MD  azithromycin (ZITHROMAX) 250 MG tablet Take 4 tabs at once with food. Patient not taking: Reported on 11/01/2015 10/12/15   Tereasa Coop, PA-C     All other systems have been reviewed and were otherwise negative with the exception of those mentioned in  the HPI and as above.  Physical Exam: There were no vitals filed for this visit.  General: Alert, no acute distress Cardiovascular: No pedal edema Respiratory: No cyanosis, no use of accessory musculature Skin: No lesions in the area of chief complaint Neurologic: Sensation intact distally Psychiatric: Patient is competent for consent with normal mood and affect Lymphatic: No axillary or cervical lymphadenopathy  MUSCULOSKELETAL: + SLR on the right  Assessment/Plan: Radiculopathy Plan for Procedure(s): RIGHT SIDED LUMBAR 5-SACRUM 1 MICRODISCECTOMY   Sinclair Ship, MD 11/02/2015 4:25 PM

## 2015-11-03 ENCOUNTER — Ambulatory Visit (HOSPITAL_COMMUNITY): Payer: 59

## 2015-11-03 ENCOUNTER — Ambulatory Visit (HOSPITAL_COMMUNITY): Payer: 59 | Admitting: Anesthesiology

## 2015-11-03 ENCOUNTER — Encounter (HOSPITAL_COMMUNITY): Payer: Self-pay | Admitting: Anesthesiology

## 2015-11-03 ENCOUNTER — Encounter (HOSPITAL_COMMUNITY)
Admission: RE | Disposition: A | Discharge status: Home / Self Care | Payer: Self-pay | Source: Ambulatory Visit | Attending: Orthopedic Surgery

## 2015-11-03 ENCOUNTER — Ambulatory Visit (HOSPITAL_COMMUNITY)
Admission: RE | Admit: 2015-11-03 | Discharge: 2015-11-03 | Disposition: A | Discharge status: Home / Self Care | Payer: 59 | Source: Ambulatory Visit | Attending: Orthopedic Surgery | Admitting: Orthopedic Surgery

## 2015-11-03 DIAGNOSIS — F17210 Nicotine dependence, cigarettes, uncomplicated: Secondary | ICD-10-CM | POA: Insufficient documentation

## 2015-11-03 DIAGNOSIS — Z809 Family history of malignant neoplasm, unspecified: Secondary | ICD-10-CM | POA: Diagnosis not present

## 2015-11-03 DIAGNOSIS — Z833 Family history of diabetes mellitus: Secondary | ICD-10-CM | POA: Diagnosis not present

## 2015-11-03 DIAGNOSIS — F1290 Cannabis use, unspecified, uncomplicated: Secondary | ICD-10-CM | POA: Insufficient documentation

## 2015-11-03 DIAGNOSIS — E669 Obesity, unspecified: Secondary | ICD-10-CM | POA: Diagnosis not present

## 2015-11-03 DIAGNOSIS — Z811 Family history of alcohol abuse and dependence: Secondary | ICD-10-CM | POA: Diagnosis not present

## 2015-11-03 DIAGNOSIS — M5410 Radiculopathy, site unspecified: Secondary | ICD-10-CM

## 2015-11-03 DIAGNOSIS — F1490 Cocaine use, unspecified, uncomplicated: Secondary | ICD-10-CM | POA: Insufficient documentation

## 2015-11-03 DIAGNOSIS — K219 Gastro-esophageal reflux disease without esophagitis: Secondary | ICD-10-CM | POA: Insufficient documentation

## 2015-11-03 DIAGNOSIS — Z01818 Encounter for other preprocedural examination: Secondary | ICD-10-CM

## 2015-11-03 DIAGNOSIS — Z6839 Body mass index (BMI) 39.0-39.9, adult: Secondary | ICD-10-CM | POA: Diagnosis not present

## 2015-11-03 DIAGNOSIS — M5116 Intervertebral disc disorders with radiculopathy, lumbar region: Secondary | ICD-10-CM | POA: Diagnosis present

## 2015-11-03 HISTORY — DX: Gastro-esophageal reflux disease without esophagitis: K21.9

## 2015-11-03 HISTORY — PX: LUMBAR LAMINECTOMY/DECOMPRESSION MICRODISCECTOMY: SHX5026

## 2015-11-03 LAB — COMPREHENSIVE METABOLIC PANEL
ALT: 16 U/L — ABNORMAL LOW (ref 17–63)
AST: 21 U/L (ref 15–41)
Albumin: 3.4 g/dL — ABNORMAL LOW (ref 3.5–5.0)
Alkaline Phosphatase: 58 U/L (ref 38–126)
Anion gap: 9 (ref 5–15)
BUN: 17 mg/dL (ref 6–20)
CO2: 25 mmol/L (ref 22–32)
Calcium: 9 mg/dL (ref 8.9–10.3)
Chloride: 106 mmol/L (ref 101–111)
Creatinine, Ser: 0.96 mg/dL (ref 0.61–1.24)
GFR calc Af Amer: >60 mL/min (ref >60)
GFR calc non Af Amer: >60 mL/min (ref >60)
Glucose, Bld: 101 mg/dL — ABNORMAL HIGH (ref 65–99)
Potassium: 3.9 mmol/L (ref 3.5–5.1)
Sodium: 140 mmol/L (ref 135–145)
Total Bilirubin: 0.4 mg/dL (ref 0.3–1.2)
Total Protein: 6.9 g/dL (ref 6.5–8.1)

## 2015-11-03 LAB — CBC WITH DIFFERENTIAL/PLATELET
Basophils Absolute: 0 10*3/uL (ref 0.0–0.1)
Basophils Relative: 0 %
Eosinophils Absolute: 0.4 10*3/uL (ref 0.0–0.7)
Eosinophils Relative: 4 %
HCT: 42.7 % (ref 39.0–52.0)
Hemoglobin: 14.4 g/dL (ref 13.0–17.0)
Lymphocytes Relative: 31 %
Lymphs Abs: 2.9 10*3/uL (ref 0.7–4.0)
MCH: 30.2 pg (ref 26.0–34.0)
MCHC: 33.7 g/dL (ref 30.0–36.0)
MCV: 89.5 fL (ref 78.0–100.0)
Monocytes Absolute: 0.7 10*3/uL (ref 0.1–1.0)
Monocytes Relative: 8 %
Neutro Abs: 5.4 10*3/uL (ref 1.7–7.7)
Neutrophils Relative %: 57 %
Platelets: 205 10*3/uL (ref 150–400)
RBC: 4.77 MIL/uL (ref 4.22–5.81)
RDW: 13.5 % (ref 11.5–15.5)
WBC: 9.4 10*3/uL (ref 4.0–10.5)

## 2015-11-03 LAB — PROTIME-INR
INR: 0.92
Prothrombin Time: 12.4 seconds (ref 11.4–15.2)

## 2015-11-03 LAB — SURGICAL PCR SCREEN
MRSA, PCR: NEGATIVE
Staphylococcus aureus: NEGATIVE

## 2015-11-03 LAB — APTT: aPTT: 28 seconds (ref 24–36)

## 2015-11-03 SURGERY — LUMBAR LAMINECTOMY/DECOMPRESSION MICRODISCECTOMY 1 LEVEL
Anesthesia: General | Laterality: Right

## 2015-11-03 MED ORDER — HEMOSTATIC AGENTS (NO CHARGE) OPTIME
TOPICAL | Status: DC | PRN
  Administered 2015-11-03: 1 via TOPICAL

## 2015-11-03 MED ORDER — METHYLENE BLUE 0.5 % INJ SOLN
INTRAVENOUS | Status: AC
  Filled 2015-11-03: qty 10

## 2015-11-03 MED ORDER — INDIGOTINDISULFONATE SODIUM 8 MG/ML IJ SOLN
INTRAMUSCULAR | Status: DC | PRN
  Administered 2015-11-03: .02 mL via INTRAVENOUS

## 2015-11-03 MED ORDER — PROPOFOL 10 MG/ML IV BOLUS
INTRAVENOUS | Status: AC
  Filled 2015-11-03: qty 40

## 2015-11-03 MED ORDER — OXYCODONE-ACETAMINOPHEN 5-325 MG PO TABS
1.0000 | ORAL_TABLET | Freq: Once | ORAL | Status: AC
  Administered 2015-11-03: 2 via ORAL

## 2015-11-03 MED ORDER — METHYLPREDNISOLONE ACETATE 40 MG/ML IJ SUSP
INTRAMUSCULAR | Status: AC
  Filled 2015-11-03: qty 1

## 2015-11-03 MED ORDER — ROCURONIUM BROMIDE 100 MG/10ML IV SOLN
INTRAVENOUS | Status: DC | PRN
  Administered 2015-11-03: 10 mg via INTRAVENOUS
  Administered 2015-11-03: 60 mg via INTRAVENOUS

## 2015-11-03 MED ORDER — FENTANYL CITRATE (PF) 100 MCG/2ML IJ SOLN
INTRAMUSCULAR | Status: DC | PRN
  Administered 2015-11-03: 50 ug via INTRAVENOUS
  Administered 2015-11-03 (×2): 100 ug via INTRAVENOUS
  Administered 2015-11-03: 50 ug via INTRAVENOUS

## 2015-11-03 MED ORDER — ARTIFICIAL TEARS OP OINT
TOPICAL_OINTMENT | OPHTHALMIC | Status: DC | PRN
  Administered 2015-11-03: 1 via OPHTHALMIC

## 2015-11-03 MED ORDER — OXYCODONE-ACETAMINOPHEN 5-325 MG PO TABS
ORAL_TABLET | ORAL | Status: AC
  Filled 2015-11-03: qty 2

## 2015-11-03 MED ORDER — MIDAZOLAM HCL 2 MG/2ML IJ SOLN
INTRAMUSCULAR | Status: AC
  Filled 2015-11-03: qty 2

## 2015-11-03 MED ORDER — DEXTROSE 5 % IV SOLN
INTRAVENOUS | Status: DC | PRN
  Administered 2015-11-03: 10 ug/min via INTRAVENOUS

## 2015-11-03 MED ORDER — ONDANSETRON HCL 4 MG/2ML IJ SOLN
INTRAMUSCULAR | Status: AC
  Filled 2015-11-03: qty 2

## 2015-11-03 MED ORDER — HYDROMORPHONE HCL 2 MG/ML IJ SOLN
INTRAMUSCULAR | Status: AC
  Filled 2015-11-03: qty 1

## 2015-11-03 MED ORDER — SODIUM CHLORIDE 0.9 % IV SOLN
INTRAVENOUS | Status: DC | PRN
  Administered 2015-11-03: 40 mL

## 2015-11-03 MED ORDER — LIDOCAINE-EPINEPHRINE (PF) 1 %-1:200000 IJ SOLN
INTRAMUSCULAR | Status: DC | PRN
  Administered 2015-11-03: 4 mL

## 2015-11-03 MED ORDER — PHENYLEPHRINE HCL 10 MG/ML IJ SOLN
INTRAMUSCULAR | Status: DC | PRN
  Administered 2015-11-03: 80 ug via INTRAVENOUS
  Administered 2015-11-03: 40 ug via INTRAVENOUS
  Administered 2015-11-03 (×2): 80 ug via INTRAVENOUS

## 2015-11-03 MED ORDER — ROCURONIUM BROMIDE 10 MG/ML (PF) SYRINGE
PREFILLED_SYRINGE | INTRAVENOUS | Status: AC
Start: 2015-11-03 — End: 2015-11-03
  Filled 2015-11-03: qty 10

## 2015-11-03 MED ORDER — SUGAMMADEX SODIUM 500 MG/5ML IV SOLN
INTRAVENOUS | Status: DC | PRN
  Administered 2015-11-03: 225 mg via INTRAVENOUS

## 2015-11-03 MED ORDER — HYDROMORPHONE HCL 1 MG/ML IJ SOLN
0.2500 mg | INTRAMUSCULAR | Status: DC | PRN
  Administered 2015-11-03: 0.5 mg via INTRAVENOUS

## 2015-11-03 MED ORDER — POVIDONE-IODINE 7.5 % EX SOLN
Freq: Once | CUTANEOUS | Status: DC
  Filled 2015-11-03: qty 118

## 2015-11-03 MED ORDER — ARTIFICIAL TEARS OP OINT
TOPICAL_OINTMENT | OPHTHALMIC | Status: AC
Start: 2015-11-03 — End: 2015-11-03
  Filled 2015-11-03: qty 3.5

## 2015-11-03 MED ORDER — PROPOFOL 10 MG/ML IV BOLUS
INTRAVENOUS | Status: DC | PRN
  Administered 2015-11-03: 40 mg via INTRAVENOUS
  Administered 2015-11-03: 200 mg via INTRAVENOUS

## 2015-11-03 MED ORDER — FENTANYL CITRATE (PF) 100 MCG/2ML IJ SOLN
INTRAMUSCULAR | Status: AC
  Filled 2015-11-03: qty 2

## 2015-11-03 MED ORDER — ONDANSETRON HCL 4 MG/2ML IJ SOLN
INTRAMUSCULAR | Status: DC | PRN
  Administered 2015-11-03: 4 mg via INTRAVENOUS

## 2015-11-03 MED ORDER — PHENYLEPHRINE 40 MCG/ML (10ML) SYRINGE FOR IV PUSH (FOR BLOOD PRESSURE SUPPORT)
PREFILLED_SYRINGE | INTRAVENOUS | Status: AC
  Filled 2015-11-03: qty 10

## 2015-11-03 MED ORDER — CEFAZOLIN SODIUM-DEXTROSE 2-4 GM/100ML-% IV SOLN
2.0000 g | INTRAVENOUS | Status: AC
  Administered 2015-11-03: 2 g via INTRAVENOUS

## 2015-11-03 MED ORDER — DIAZEPAM 5 MG PO TABS
ORAL_TABLET | ORAL | Status: AC
  Filled 2015-11-03: qty 1

## 2015-11-03 MED ORDER — LACTATED RINGERS IV SOLN
INTRAVENOUS | Status: DC | PRN
  Administered 2015-11-03 (×2): via INTRAVENOUS

## 2015-11-03 MED ORDER — THROMBIN 20000 UNITS EX SOLR
CUTANEOUS | Status: DC | PRN
  Administered 2015-11-03: 09:00:00 via TOPICAL

## 2015-11-03 MED ORDER — LIDOCAINE 2% (20 MG/ML) 5 ML SYRINGE
INTRAMUSCULAR | Status: AC
  Filled 2015-11-03: qty 5

## 2015-11-03 MED ORDER — 0.9 % SODIUM CHLORIDE (POUR BTL) OPTIME
TOPICAL | Status: DC | PRN
  Administered 2015-11-03: 1000 mL

## 2015-11-03 MED ORDER — PROMETHAZINE HCL 25 MG/ML IJ SOLN: 6.2500 mg | INTRAMUSCULAR | Status: DC | PRN

## 2015-11-03 MED ORDER — MIDAZOLAM HCL 5 MG/5ML IJ SOLN
INTRAMUSCULAR | Status: DC | PRN
  Administered 2015-11-03: 2 mg via INTRAVENOUS

## 2015-11-03 MED ORDER — CEFAZOLIN SODIUM-DEXTROSE 2-4 GM/100ML-% IV SOLN
INTRAVENOUS | Status: AC
  Filled 2015-11-03: qty 100

## 2015-11-03 MED ORDER — METHYLPREDNISOLONE ACETATE 40 MG/ML IJ SUSP
INTRAMUSCULAR | Status: DC | PRN
  Administered 2015-11-03: 40 mg

## 2015-11-03 MED ORDER — THROMBIN 20000 UNITS EX SOLR
CUTANEOUS | Status: AC
  Filled 2015-11-03: qty 20000

## 2015-11-03 MED ORDER — MUPIROCIN 2 % EX OINT
TOPICAL_OINTMENT | CUTANEOUS | Status: AC
  Administered 2015-11-03: 1
  Filled 2015-11-03: qty 22

## 2015-11-03 MED ORDER — LIDOCAINE-EPINEPHRINE (PF) 1 %-1:200000 IJ SOLN
INTRAMUSCULAR | Status: AC
  Filled 2015-11-03: qty 30

## 2015-11-03 MED ORDER — LIDOCAINE HCL (CARDIAC) 20 MG/ML IV SOLN
INTRAVENOUS | Status: DC | PRN
  Administered 2015-11-03: 100 mg via INTRAVENOUS

## 2015-11-03 MED ORDER — DIAZEPAM 5 MG PO TABS
5.0000 mg | ORAL_TABLET | Freq: Once | ORAL | Status: AC
  Administered 2015-11-03: 5 mg via ORAL

## 2015-11-03 SURGICAL SUPPLY — 68 items
APL SKNCLS STERI-STRIP NONHPOA (GAUZE/BANDAGES/DRESSINGS)
BENZOIN TINCTURE PRP APPL 2/3 (GAUZE/BANDAGES/DRESSINGS) IMPLANT
BUR ROUND PRECISION 4.0 (BURR) IMPLANT
CANISTER SUCTION 2500CC (MISCELLANEOUS) ×2 IMPLANT
CARTRIDGE OIL MAESTRO DRILL (MISCELLANEOUS) ×1 IMPLANT
CORDS BIPOLAR (ELECTRODE) ×2 IMPLANT
COVER SURGICAL LIGHT HANDLE (MISCELLANEOUS) ×2 IMPLANT
DIFFUSER DRILL AIR PNEUMATIC (MISCELLANEOUS) ×2 IMPLANT
DRAIN CHANNEL 15F RND FF W/TCR (WOUND CARE) IMPLANT
DRAPE POUCH INSTRU U-SHP 10X18 (DRAPES) ×4 IMPLANT
DRAPE SURG 17X23 STRL (DRAPES) ×8 IMPLANT
DURAPREP 26ML APPLICATOR (WOUND CARE) ×2 IMPLANT
ELECT BLADE 4.0 EZ CLEAN MEGAD (MISCELLANEOUS)
ELECT CAUTERY BLADE 6.4 (BLADE) ×2 IMPLANT
ELECT REM PT RETURN 9FT ADLT (ELECTROSURGICAL) ×2
ELECTRODE BLDE 4.0 EZ CLN MEGD (MISCELLANEOUS) IMPLANT
ELECTRODE REM PT RTRN 9FT ADLT (ELECTROSURGICAL) ×1 IMPLANT
EVACUATOR SILICONE 100CC (DRAIN) IMPLANT
FILTER STRAW FLUID ASPIR (MISCELLANEOUS) ×2 IMPLANT
GAUZE SPONGE 4X4 12PLY STRL (GAUZE/BANDAGES/DRESSINGS) ×2 IMPLANT
GAUZE SPONGE 4X4 16PLY XRAY LF (GAUZE/BANDAGES/DRESSINGS) ×4 IMPLANT
GLOVE BIO SURGEON STRL SZ7 (GLOVE) ×2 IMPLANT
GLOVE BIO SURGEON STRL SZ8 (GLOVE) ×2 IMPLANT
GLOVE BIOGEL PI IND STRL 7.0 (GLOVE) ×1 IMPLANT
GLOVE BIOGEL PI IND STRL 8 (GLOVE) ×1 IMPLANT
GLOVE BIOGEL PI INDICATOR 7.0 (GLOVE) ×1
GLOVE BIOGEL PI INDICATOR 8 (GLOVE) ×1
GOWN STRL REUS W/ TWL LRG LVL3 (GOWN DISPOSABLE) ×1 IMPLANT
GOWN STRL REUS W/ TWL XL LVL3 (GOWN DISPOSABLE) ×2 IMPLANT
GOWN STRL REUS W/TWL LRG LVL3 (GOWN DISPOSABLE) ×2
GOWN STRL REUS W/TWL XL LVL3 (GOWN DISPOSABLE) ×4
IV CATH 14GX2 1/4 (CATHETERS) ×2 IMPLANT
KIT BASIN OR (CUSTOM PROCEDURE TRAY) ×2 IMPLANT
KIT POSITION SURG JACKSON T1 (MISCELLANEOUS) ×2 IMPLANT
KIT ROOM TURNOVER OR (KITS) ×2 IMPLANT
NEEDLE 18GX1X1/2 (RX/OR ONLY) (NEEDLE) ×2 IMPLANT
NEEDLE 22X1 1/2 (OR ONLY) (NEEDLE) ×2 IMPLANT
NEEDLE HYPO 25GX1X1/2 BEV (NEEDLE) ×2 IMPLANT
NEEDLE SPNL 18GX3.5 QUINCKE PK (NEEDLE) ×4 IMPLANT
NS IRRIG 1000ML POUR BTL (IV SOLUTION) ×2 IMPLANT
OIL CARTRIDGE MAESTRO DRILL (MISCELLANEOUS) ×2
PACK LAMINECTOMY ORTHO (CUSTOM PROCEDURE TRAY) ×2 IMPLANT
PACK UNIVERSAL I (CUSTOM PROCEDURE TRAY) ×2 IMPLANT
PAD ARMBOARD 7.5X6 YLW CONV (MISCELLANEOUS) ×4 IMPLANT
PATTIES SURGICAL .5 X.5 (GAUZE/BANDAGES/DRESSINGS) IMPLANT
PATTIES SURGICAL .5 X1 (DISPOSABLE) ×2 IMPLANT
SPONGE INTESTINAL PEANUT (DISPOSABLE) ×2 IMPLANT
SPONGE SURGIFOAM ABS GEL 100 (HEMOSTASIS) ×2 IMPLANT
SPONGE SURGIFOAM ABS GEL SZ50 (HEMOSTASIS) ×2 IMPLANT
STRIP CLOSURE SKIN 1/2X4 (GAUZE/BANDAGES/DRESSINGS) ×2 IMPLANT
SURGIFLO W/THROMBIN 8M KIT (HEMOSTASIS) ×1 IMPLANT
SUT MNCRL AB 4-0 PS2 18 (SUTURE) ×2 IMPLANT
SUT VIC AB 0 CT1 18XCR BRD 8 (SUTURE) IMPLANT
SUT VIC AB 0 CT1 27 (SUTURE)
SUT VIC AB 0 CT1 27XBRD ANBCTR (SUTURE) IMPLANT
SUT VIC AB 0 CT1 8-18 (SUTURE)
SUT VIC AB 1 CT1 18XCR BRD 8 (SUTURE) ×1 IMPLANT
SUT VIC AB 1 CT1 8-18 (SUTURE) ×2
SUT VIC AB 2-0 CT2 18 VCP726D (SUTURE) ×2 IMPLANT
SYR 20CC LL (SYRINGE) ×2 IMPLANT
SYR BULB IRRIGATION 50ML (SYRINGE) ×2 IMPLANT
SYR CONTROL 10ML LL (SYRINGE) ×4 IMPLANT
SYR TB 1ML 26GX3/8 SAFETY (SYRINGE) ×4 IMPLANT
SYR TB 1ML LUER SLIP (SYRINGE) ×4 IMPLANT
TOWEL OR 17X24 6PK STRL BLUE (TOWEL DISPOSABLE) ×2 IMPLANT
TOWEL OR 17X26 10 PK STRL BLUE (TOWEL DISPOSABLE) ×2 IMPLANT
WATER STERILE IRR 1000ML POUR (IV SOLUTION) ×2 IMPLANT
YANKAUER SUCT BULB TIP NO VENT (SUCTIONS) ×2 IMPLANT

## 2015-11-03 NOTE — Transfer of Care (Signed)
Immediate Anesthesia Transfer of Care Note  Patient: Shane Arroyo  Procedure(s) Performed: Procedure(s) with comments: RIGHT SIDED LUMBAR 5-SACRUM 1 MICRODISCECTOMY (Right) - RIGHT SIDED LUMBAR 5-SACRUM 1 MICRODISCECTOMY  Patient Location: PACU  Anesthesia Type:General  Level of Consciousness: awake, alert , oriented and sedated  Airway & Oxygen Therapy: Patient Spontanous Breathing and Patient connected to face mask oxygen  Post-op Assessment: Report given to RN, Post -op Vital signs reviewed and stable and Patient moving all extremities  Post vital signs: Reviewed and stable  Last Vitals:  Vitals:   11/03/15 0642  BP: 117/79  Pulse: 75  Resp: 18  Temp: 37.1 C    Last Pain:  Vitals:   11/03/15 0642  TempSrc: Oral      Patients Stated Pain Goal: 3 ([id number])  Complications: No apparent anesthesia complications

## 2015-11-03 NOTE — Progress Notes (Signed)
Report given to Ly rn as caregiver

## 2015-11-03 NOTE — Anesthesia Procedure Notes (Signed)
Procedure Name: Intubation Date/Time: 11/03/2015 8:02 AM Performed by: Scheryl Darter Pre-anesthesia Checklist: Patient identified, Emergency Drugs available, Suction available and Patient being monitored Patient Re-evaluated:Patient Re-evaluated prior to inductionOxygen Delivery Method: Circle System Utilized Preoxygenation: Pre-oxygenation with 100% oxygen Intubation Type: IV induction Ventilation: Mask ventilation without difficulty Laryngoscope Size: Mac and 4 Grade View: Grade II Tube type: Oral Tube size: 7.5 mm Number of attempts: 1 Airway Equipment and Method: Stylet and Oral airway Placement Confirmation: ETT inserted through vocal cords under direct vision,  positive ETCO2 and breath sounds checked- equal and bilateral Secured at: 23 cm Tube secured with: Tape Dental Injury: Teeth and Oropharynx as per pre-operative assessment

## 2015-11-03 NOTE — Anesthesia Preprocedure Evaluation (Addendum)
Anesthesia Evaluation  Patient identified by MRN, date of birth, ID band Patient awake    Reviewed: Allergy & Precautions, NPO status   Airway Mallampati: II  TM Distance: >3 FB Neck ROM: Full    Dental  (+) Teeth Intact   Pulmonary Current Smoker,    breath sounds clear to auscultation       Cardiovascular negative cardio ROS   Rhythm:Regular Rate:Normal     Neuro/Psych negative neurological ROS     GI/Hepatic GERD  ,(+)     substance abuse  cocaine use and marijuana use,   Endo/Other    Renal/GU      Musculoskeletal   Abdominal (+) + obese,   Peds  Hematology   Anesthesia Other Findings   Reproductive/Obstetrics                            Anesthesia Physical Anesthesia Plan  ASA: II  Anesthesia Plan: General   Post-op Pain Management:    Induction: Intravenous  Airway Management Planned: Oral ETT  Additional Equipment:   Intra-op Plan:   Post-operative Plan: Extubation in OR  Informed Consent: I have reviewed the patients History and Physical, chart, labs and discussed the procedure including the risks, benefits and alternatives for the proposed anesthesia with the patient or authorized representative who has indicated his/her understanding and acceptance.   Dental advisory given  Plan Discussed with: CRNA  Anesthesia Plan Comments:         Anesthesia Quick Evaluation

## 2015-11-03 NOTE — Anesthesia Postprocedure Evaluation (Signed)
Anesthesia Post Note  Patient: Shane Arroyo  Procedure(s) Performed: Procedure(s) (LRB): RIGHT SIDED LUMBAR 5-SACRUM 1 MICRODISCECTOMY (Right)  Patient location during evaluation: PACU Anesthesia Type: General Level of consciousness: awake and alert Pain management: pain level controlled Vital Signs Assessment: post-procedure vital signs reviewed and stable Respiratory status: spontaneous breathing, nonlabored ventilation, respiratory function stable and patient connected to nasal cannula oxygen Cardiovascular status: blood pressure returned to baseline and stable Postop Assessment: no signs of nausea or vomiting Anesthetic complications: no    Last Vitals:  Vitals:   11/03/15 1200 11/03/15 1230  BP:    Pulse: 82 81  Resp: 15 16  Temp:      Last Pain:  Vitals:   11/03/15 1230  TempSrc:   PainSc: 6                  Jaycelynn Knickerbocker,JAMES TERRILL

## 2015-11-04 ENCOUNTER — Encounter (HOSPITAL_COMMUNITY): Payer: Self-pay | Admitting: Orthopedic Surgery

## 2015-11-04 NOTE — Op Note (Signed)
Shane Arroyo, Shane Arroyo NO.:  [id number]  MEDICAL RECORD NO.:  [id number]  LOCATION:  [facility]                         FACILITY:  [facility]  PHYSICIAN:  Phylliss Bob, MD      DATE OF BIRTH:  07/18/1975  DATE OF PROCEDURE:  11/03/2015                              OPERATIVE REPORT   PREOPERATIVE DIAGNOSES: 1. Right-sided S1 radiculopathy. 2. Right L5-S1 disk herniation.  POSTOPERATIVE DIAGNOSES: 1. Right-sided S1 radiculopathy. 2. Right L5-S1 disk herniation.  PROCEDURES:  Right-sided L5-S1 laminotomy with partial facetectomy and removal of right L5-S1 disk herniation.  SURGEON:  Phylliss Bob, MD  ASSISTANT:  Pricilla Holm, PA-C.  ANESTHESIA:  General endotracheal anesthesia.  COMPLICATIONS:  None.  DISPOSITION:  Stable.  ESTIMATED BLOOD LOSS:  Minimal.  INDICATIONS FOR SURGERY:  Briefly, [name] is a pleasant 40 year old male, who did present to me with severe debilitating pain in the right leg.  An MRI did reveal a prominent disc protrusion on the right at L5- S1, displacing the right S1 nerve.  He did also have a very small protrusion on the right at L4-5, which I did not feel was likely to be symptomatic, given the location of the patient's pain.  He did fail nonoperative measures and wish to proceed with the surgery noted above.  OPERATIVE DETAILS:  On November 03, 2015, the patient was brought to surgery and general endotracheal anesthesia was administered.  The patient was placed prone on a well-padded flat Jackson bed with a spinal frame.  Antibiotics were given and a time-out procedure was performed. A midline incision was made overlying the L5-S1 intervertebral space. The fascia was incised in a curvilinear fashion just to the right of the midline.  The paraspinal musculature was bluntly retracted laterally.  A Taylor retractor was placed over the lateral aspect of the right L4-5 facet joint for retraction.  I did use a series of Kerrison  punches to perform a laminotomy and partial facetectomy on the right.  The right S1 nerve was identified and noted to be very swollen and erythematous.  I was, however, able to retract the nerve medially.  Immediately beneath the nerve, a prominent right L5-S1 disk herniation was noted and was removed.  The intervertebral space was not entered.  Adequate decompression of the nerve was confirmed.  The wound was then copiously irrigated, and all bleeding was controlled.  The wound was then closed in layers using #1 Vicryl, followed by 2-0 Vicryl, followed by 4-0 Monocryl.  Benzoin and Steri-Strips were applied, followed by sterile dressing.  All instrument counts were correct at the termination of the procedure.  Of note, Pricilla Holm was my assistant throughout surgery and did aid in retraction, suctioning, and closure from start to finish.  Of note, I did contact patient on POD #1, and he did report resolution of his right leg pain.      Phylliss Bob, MD     MD/MEDQ  D:  11/03/2015  T:  11/04/2015  Job:  [id number]

## 2015-11-08 ENCOUNTER — Telehealth: Payer: Self-pay | Admitting: Family Medicine

## 2015-11-08 NOTE — Telephone Encounter (Signed)
No answer mail box full mailed letter of labs to pt it was sent back with wrong address tried to get correct address mailbox full

## 2016-04-09 ENCOUNTER — Ambulatory Visit (HOSPITAL_BASED_OUTPATIENT_CLINIC_OR_DEPARTMENT_OTHER)
Admission: RE | Admit: 2016-04-09 | Discharge: 2016-04-09 | Disposition: A | Discharge status: Home / Self Care | Payer: 59 | Source: Ambulatory Visit | Attending: Family Medicine | Admitting: Family Medicine

## 2016-04-09 ENCOUNTER — Other Ambulatory Visit: Payer: Self-pay | Admitting: Family Medicine

## 2016-04-09 DIAGNOSIS — R1030 Lower abdominal pain, unspecified: Secondary | ICD-10-CM | POA: Insufficient documentation

## 2016-04-09 DIAGNOSIS — Z8719 Personal history of other diseases of the digestive system: Secondary | ICD-10-CM

## 2016-04-09 DIAGNOSIS — K5732 Diverticulitis of large intestine without perforation or abscess without bleeding: Secondary | ICD-10-CM | POA: Insufficient documentation

## 2016-04-09 MED ORDER — IOPAMIDOL (ISOVUE-300) INJECTION 61%
100.0000 mL | Freq: Once | INTRAVENOUS | Status: AC | PRN
  Administered 2016-04-09: 100 mL via INTRAVENOUS

## 2017-03-06 ENCOUNTER — Encounter (HOSPITAL_COMMUNITY): Payer: Self-pay

## 2017-03-06 ENCOUNTER — Emergency Department (HOSPITAL_COMMUNITY)
Admission: EM | Admit: 2017-03-06 | Discharge: 2017-03-06 | Disposition: A | Discharge status: Home / Self Care | Payer: Self-pay | Attending: Emergency Medicine | Admitting: Emergency Medicine

## 2017-03-06 ENCOUNTER — Emergency Department (HOSPITAL_COMMUNITY): Payer: Self-pay

## 2017-03-06 DIAGNOSIS — F17210 Nicotine dependence, cigarettes, uncomplicated: Secondary | ICD-10-CM | POA: Insufficient documentation

## 2017-03-06 DIAGNOSIS — K5732 Diverticulitis of large intestine without perforation or abscess without bleeding: Secondary | ICD-10-CM | POA: Insufficient documentation

## 2017-03-06 DIAGNOSIS — K5792 Diverticulitis of intestine, part unspecified, without perforation or abscess without bleeding: Secondary | ICD-10-CM

## 2017-03-06 HISTORY — DX: Diverticulitis of intestine, part unspecified, without perforation or abscess without bleeding: K57.92

## 2017-03-06 LAB — LIPASE, BLOOD: Lipase: 29 U/L (ref 11–51)

## 2017-03-06 LAB — CBC WITH DIFFERENTIAL/PLATELET
Basophils Absolute: 0 10*3/uL (ref 0.0–0.1)
Basophils Relative: 0 %
Eosinophils Absolute: 0.2 10*3/uL (ref 0.0–0.7)
Eosinophils Relative: 3 %
HCT: 43.6 % (ref 39.0–52.0)
Hemoglobin: 14.9 g/dL (ref 13.0–17.0)
Lymphocytes Relative: 40 %
Lymphs Abs: 2.9 10*3/uL (ref 0.7–4.0)
MCH: 30.7 pg (ref 26.0–34.0)
MCHC: 34.2 g/dL (ref 30.0–36.0)
MCV: 89.9 fL (ref 78.0–100.0)
Monocytes Absolute: 0.6 10*3/uL (ref 0.1–1.0)
Monocytes Relative: 8 %
Neutro Abs: 3.5 10*3/uL (ref 1.7–7.7)
Neutrophils Relative %: 49 %
Platelets: 214 10*3/uL (ref 150–400)
RBC: 4.85 MIL/uL (ref 4.22–5.81)
RDW: 12.9 % (ref 11.5–15.5)
WBC: 7.2 10*3/uL (ref 4.0–10.5)

## 2017-03-06 LAB — COMPREHENSIVE METABOLIC PANEL
ALT: 14 U/L — ABNORMAL LOW (ref 17–63)
AST: 20 U/L (ref 15–41)
Albumin: 3.6 g/dL (ref 3.5–5.0)
Alkaline Phosphatase: 60 U/L (ref 38–126)
Anion gap: 8 (ref 5–15)
BUN: 10 mg/dL (ref 6–20)
CO2: 26 mmol/L (ref 22–32)
Calcium: 8.5 mg/dL — ABNORMAL LOW (ref 8.9–10.3)
Chloride: 106 mmol/L (ref 101–111)
Creatinine, Ser: 1.11 mg/dL (ref 0.61–1.24)
GFR calc Af Amer: >60 mL/min (ref >60)
GFR calc non Af Amer: >60 mL/min (ref >60)
Glucose, Bld: 91 mg/dL (ref 65–99)
Potassium: 4 mmol/L (ref 3.5–5.1)
Sodium: 140 mmol/L (ref 135–145)
Total Bilirubin: 0.4 mg/dL (ref 0.3–1.2)
Total Protein: 6.9 g/dL (ref 6.5–8.1)

## 2017-03-06 MED ORDER — IOPAMIDOL (ISOVUE-300) INJECTION 61%
100.0000 mL | Freq: Once | INTRAVENOUS | Status: AC | PRN
  Administered 2017-03-06: 100 mL via INTRAVENOUS

## 2017-03-06 MED ORDER — FENTANYL CITRATE (PF) 100 MCG/2ML IJ SOLN
50.0000 ug | Freq: Once | INTRAMUSCULAR | Status: AC
  Administered 2017-03-06: 50 ug via INTRAVENOUS
  Filled 2017-03-06: qty 2

## 2017-03-06 MED ORDER — SODIUM CHLORIDE 0.9 % IV BOLUS (SEPSIS)
1000.0000 mL | Freq: Once | INTRAVENOUS | Status: AC
  Administered 2017-03-06: 1000 mL via INTRAVENOUS

## 2017-03-06 MED ORDER — SODIUM CHLORIDE 0.9 % IJ SOLN
INTRAMUSCULAR | Status: AC
  Filled 2017-03-06: qty 50

## 2017-03-06 MED ORDER — IOPAMIDOL (ISOVUE-300) INJECTION 61%
INTRAVENOUS | Status: AC
  Administered 2017-03-06: 100 mL via INTRAVENOUS
  Filled 2017-03-06: qty 100

## 2017-03-06 NOTE — ED Triage Notes (Signed)
Pt complains of abd pain for three days Pt has hx of diverticulitis and he says the pain feels the same

## 2017-03-06 NOTE — Discharge Instructions (Addendum)
Your CT scan showed mild diverticulitis, there is no abscess or perforation in your bowel. Continue to take your Flagyl and Cipro as prescribed. And follow up with your primary care provider in the next 3 days for re-evaluation. Return to the emergency department for any new or worsening symptoms.

## 2017-03-06 NOTE — ED Provider Notes (Signed)
[facility] DEPT Provider Note   CSN: [id number] Arrival date & time: 03/06/17  5102     History   Chief Complaint Chief Complaint  Patient presents with  . Abdominal Pain    HPI Shane Arroyo is a 42 y.o. male with a hx of diverticulitis, GERD, and tobacco abuse who presents to the ED with complaint of lower abdominal pain for the past 3 days. Patient describes the pain as a constant, progressively worsening throbbing sensation located in the lower abdomen. Patient rates pain an 8/10 in severity at present, has tried ibuprofen without relief. No specific alleviating/aggravating factors.  Reports this feels similar to previous diverticulitis.  He was prescribed Ciprofloxacin and Flagyl by his primary care provider to take as needed when he feels symptoms of diverticulitis are coming on.  States he felt similar 3 weeks ago took a few days worth of these antibiotics with resolution.  States that with onset of pain over the past 3 days he has been taking the antibiotics daily without improvement prompting ED visit.  Reports a couple episodes of nonbloody emesis.  No other associated symptoms.  Denies nausea, diarrhea, blood in stool, constipation, dysuria, fever, or testicular pain/swelling.  HPI  Past Medical History:  Diagnosis Date  . Diverticulitis   . GERD (gastroesophageal reflux disease)     There are no active problems to display for this patient.   Past Surgical History:  Procedure Laterality Date  . LUMBAR LAMINECTOMY/DECOMPRESSION MICRODISCECTOMY Right 11/03/2015   Procedure: RIGHT SIDED LUMBAR 5-SACRUM 1 MICRODISCECTOMY;  Surgeon: Phylliss Bob, MD;  Location: [facility];  Service: Orthopedics;  Laterality: Right;  RIGHT SIDED LUMBAR 5-SACRUM 1 MICRODISCECTOMY  . NO PAST SURGERIES    . WISDOM TOOTH EXTRACTION     2 taken out       Home Medications    Prior to Admission medications   Medication Sig Start Date End Date Taking?  Authorizing Provider  azithromycin (ZITHROMAX) 250 MG tablet Take 4 tabs at once with food. Patient not taking: Reported on 11/01/2015 10/12/15   Tereasa Coop, PA-C    Family History Family History  Problem Relation Age of Onset  . Diabetes Father   . Cancer Maternal Grandmother   . Alcohol abuse Maternal Grandfather   . Diabetes Paternal Grandmother   . Diabetes Paternal Grandfather     Social History Social History   Tobacco Use  . Smoking status: Current Every Day Smoker    Packs/day: 0.50    Types: Cigarettes  . Smokeless tobacco: Never Used  Substance Use Topics  . Alcohol use: Yes    Comment: mainly on weekends  . Drug use: Yes    Types: Marijuana, Cocaine    Comment: last cocaine use 10/31/15, last marijuana use 11/01/15     Allergies   Patient has no known allergies.   Review of Systems Review of Systems  Constitutional: Negative for fever.  Respiratory: Negative for cough and shortness of breath.   Cardiovascular: Negative for chest pain.  Gastrointestinal: Positive for abdominal pain and vomiting. Negative for blood in stool, constipation, diarrhea and nausea.  Genitourinary: Negative for dysuria, scrotal swelling and testicular pain.  All other systems reviewed and are negative.    Physical Exam Updated Vital Signs BP (!) 134/93   Pulse 80   Temp 98.2 F (36.8 C) (Oral)   Resp 20   SpO2 98%   Physical Exam  Constitutional: He appears well-developed and well-nourished. No distress.  HENT:  Head: Normocephalic and atraumatic.  Eyes: Conjunctivae are normal. Right eye exhibits no discharge. Left eye exhibits no discharge.  Cardiovascular: Normal rate and regular rhythm.  No murmur heard. Pulmonary/Chest: Breath sounds normal. No respiratory distress. He has no wheezes. He has no rales.  Abdominal: Soft. He exhibits no distension. There is tenderness in the right lower quadrant and left lower quadrant. There is no rigidity, no rebound, no  guarding, no CVA tenderness and no tenderness at McBurney's point.  Neurological: He is alert.  Clear speech.   Skin: Skin is warm and dry. No rash noted.  Psychiatric: He has a normal mood and affect. His behavior is normal.  Nursing note and vitals reviewed.  ED Treatments / Results  Labs Results for orders placed or performed during the hospital encounter of 03/06/17  Comprehensive metabolic panel  Result Value Ref Range   Sodium 140 135 - 145 mmol/L   Potassium 4.0 3.5 - 5.1 mmol/L   Chloride 106 101 - 111 mmol/L   CO2 26 22 - 32 mmol/L   Glucose, Bld 91 65 - 99 mg/dL   BUN 10 6 - 20 mg/dL   Creatinine, Ser 1.11 0.61 - 1.24 mg/dL   Calcium 8.5 (L) 8.9 - 10.3 mg/dL   Total Protein 6.9 6.5 - 8.1 g/dL   Albumin 3.6 3.5 - 5.0 g/dL   AST 20 15 - 41 U/L   ALT 14 (L) 17 - 63 U/L   Alkaline Phosphatase 60 38 - 126 U/L   Total Bilirubin 0.4 0.3 - 1.2 mg/dL   GFR calc non Af Amer >60 >60 mL/min   GFR calc Af Amer >60 >60 mL/min   Anion gap 8 5 - 15  Lipase, blood  Result Value Ref Range   Lipase 29 11 - 51 U/L  CBC with Diff  Result Value Ref Range   WBC 7.2 4.0 - 10.5 K/uL   RBC 4.85 4.22 - 5.81 MIL/uL   Hemoglobin 14.9 13.0 - 17.0 g/dL   HCT 43.6 39.0 - 52.0 %   MCV 89.9 78.0 - 100.0 fL   MCH 30.7 26.0 - 34.0 pg   MCHC 34.2 30.0 - 36.0 g/dL   RDW 12.9 11.5 - 15.5 %   Platelets 214 150 - 400 K/uL   Neutrophils Relative % 49 %   Neutro Abs 3.5 1.7 - 7.7 K/uL   Lymphocytes Relative 40 %   Lymphs Abs 2.9 0.7 - 4.0 K/uL   Monocytes Relative 8 %   Monocytes Absolute 0.6 0.1 - 1.0 K/uL   Eosinophils Relative 3 %   Eosinophils Absolute 0.2 0.0 - 0.7 K/uL   Basophils Relative 0 %   Basophils Absolute 0.0 0.0 - 0.1 K/uL   EKG  EKG Interpretation None      Radiology Ct Abdomen Pelvis W Contrast  Result Date: 03/06/2017 CLINICAL DATA:  Abdominal pain x3 days, history of diverticulitis EXAM: CT ABDOMEN AND PELVIS WITH CONTRAST TECHNIQUE: Multidetector CT imaging of the  abdomen and pelvis was performed using the standard protocol following bolus administration of intravenous contrast. CONTRAST:  100 mL Isovue 300 IV COMPARISON:  04/09/2016 FINDINGS: Lower chest: Lung bases are clear. Hepatobiliary: Liver is within normal limits. Gallbladder is unremarkable. No intrahepatic or extrahepatic duct dilatation. Pancreas: Within normal limits. Spleen: Within normal limits. Adrenals/Urinary Tract: Adrenal glands are within normal limits. Kidneys are within normal limits.  No hydronephrosis. Bladder is within normal limits. Stomach/Bowel: Stomach is within normal limits. No evidence of bowel obstruction.  Distal appendix is mildly prominent (coronal image 45), but without associated inflammatory changes to suggest acute appendicitis. Left colonic diverticulosis, with very mild inflammatory changes involving the proximal sigmoid colon (series 2/image 64), compatible with mild acute diverticulitis. No drainable fluid collection/abscess. No free air. Vascular/Lymphatic: No evidence of abdominal aortic aneurysm. Circumaortic left renal vein. No suspicious abdominopelvic lymphadenopathy. Reproductive: Prostate is unremarkable. Other: No abdominopelvic ascites. Musculoskeletal: Mild degenerative changes of the lower lumbar spine. IMPRESSION: Mild acute sigmoid diverticulitis. No drainable fluid collection/abscess.  No free air. Electronically Signed   By: Julian Hy M.D.   On: 03/06/2017 10:39    Procedures Procedures (including critical care time)  Medications Ordered in ED Medications  sodium chloride 0.9 % injection (not administered)  sodium chloride 0.9 % bolus 1,000 mL (0 mLs Intravenous Stopped 03/06/17 1212)  fentaNYL (SUBLIMAZE) injection 50 mcg (50 mcg Intravenous Given 03/06/17 0842)  iopamidol (ISOVUE-300) 61 % injection 100 mL (100 mLs Intravenous Contrast Given 03/06/17 0957)    Initial Impression / Assessment and Plan / ED Course  I have reviewed the triage vital  signs and the nursing notes.  Pertinent labs & imaging results that were available during my care of the patient were reviewed by me and considered in my medical decision making (see chart for details).   Presents with abdominal pain.  Patient is nontoxic-appearing, no apparent distress, vitals within normal limits other than elevated BP-no indication of hypertensive emergency, normalized throughout visit .  Patient tender to palpation to bilateral lower quadrant.  No peritoneal signs.  Will evaluate with screening lab work and CT abdomen pelvis with contrast given progressive worsening of discomfort on abx.  Will initiate treatment with fluids and analgesics.  09:52: RE-EVAL: Patient states he is feeling much better, rates pain a 2/10, he is resting comfortably pending UA and CT scan.   CT scan with mild acute sigmoid diverticulitis- no drainable fluid collection/abscess, no free air. Patient tolerating PO in the emergency department and resting comfortably. Findings discussed with supervising physician Dr. Kathrynn Humble- recommended having patient continue his prescribed ciprofloxacin and flagyl with OTC pain medication and PCP follow up with return precautions.   I discussed results, treatment plan, need for PCP follow-up, and return precautions with the patient. Provided opportunity for questions, patient confirmed understanding and is in agreement with plan.   Final Clinical Impressions(s) / ED Diagnoses   Final diagnoses:  Diverticulitis    ED Discharge Orders    None       Amaryllis Dyke, PA-C 03/06/17 1243    Varney Biles, MD 03/06/17 [phone]

## 2018-11-08 ENCOUNTER — Other Ambulatory Visit: Payer: Self-pay

## 2018-11-08 ENCOUNTER — Emergency Department (HOSPITAL_COMMUNITY)
Admission: EM | Admit: 2018-11-08 | Discharge: 2018-11-08 | Disposition: A | Discharge status: Home / Self Care | Payer: Self-pay | Attending: Emergency Medicine | Admitting: Emergency Medicine

## 2018-11-08 ENCOUNTER — Emergency Department (HOSPITAL_COMMUNITY): Payer: Self-pay

## 2018-11-08 ENCOUNTER — Encounter (HOSPITAL_COMMUNITY): Payer: Self-pay

## 2018-11-08 DIAGNOSIS — K5732 Diverticulitis of large intestine without perforation or abscess without bleeding: Secondary | ICD-10-CM | POA: Insufficient documentation

## 2018-11-08 DIAGNOSIS — F17210 Nicotine dependence, cigarettes, uncomplicated: Secondary | ICD-10-CM | POA: Insufficient documentation

## 2018-11-08 DIAGNOSIS — Z79899 Other long term (current) drug therapy: Secondary | ICD-10-CM | POA: Insufficient documentation

## 2018-11-08 LAB — CBC
HCT: 48.2 % (ref 39.0–52.0)
Hemoglobin: 16.1 g/dL (ref 13.0–17.0)
MCH: 30.7 pg (ref 26.0–34.0)
MCHC: 33.4 g/dL (ref 30.0–36.0)
MCV: 91.8 fL (ref 80.0–100.0)
Platelets: 217 10*3/uL (ref 150–400)
RBC: 5.25 MIL/uL (ref 4.22–5.81)
RDW: 12.5 % (ref 11.5–15.5)
WBC: 12 10*3/uL — ABNORMAL HIGH (ref 4.0–10.5)
nRBC: 0 % (ref 0.0–0.2)

## 2018-11-08 LAB — URINALYSIS, ROUTINE W REFLEX MICROSCOPIC
Bilirubin Urine: NEGATIVE
Glucose, UA: NEGATIVE mg/dL
Hgb urine dipstick: NEGATIVE
Ketones, ur: NEGATIVE mg/dL
Leukocytes,Ua: NEGATIVE
Nitrite: NEGATIVE
Protein, ur: NEGATIVE mg/dL
Specific Gravity, Urine: >1.046 — ABNORMAL HIGH (ref 1.005–1.030)
pH: 6 (ref 5.0–8.0)

## 2018-11-08 LAB — COMPREHENSIVE METABOLIC PANEL
ALT: 15 U/L (ref 0–44)
AST: 17 U/L (ref 15–41)
Albumin: 4.3 g/dL (ref 3.5–5.0)
Alkaline Phosphatase: 65 U/L (ref 38–126)
Anion gap: 10 (ref 5–15)
BUN: 12 mg/dL (ref 6–20)
CO2: 27 mmol/L (ref 22–32)
Calcium: 9.2 mg/dL (ref 8.9–10.3)
Chloride: 101 mmol/L (ref 98–111)
Creatinine, Ser: 0.99 mg/dL (ref 0.61–1.24)
GFR calc Af Amer: >60 mL/min (ref >60)
GFR calc non Af Amer: >60 mL/min (ref >60)
Glucose, Bld: 97 mg/dL (ref 70–99)
Potassium: 4.2 mmol/L (ref 3.5–5.1)
Sodium: 138 mmol/L (ref 135–145)
Total Bilirubin: 0.6 mg/dL (ref 0.3–1.2)
Total Protein: 8.4 g/dL — ABNORMAL HIGH (ref 6.5–8.1)

## 2018-11-08 LAB — LIPASE, BLOOD: Lipase: 31 U/L (ref 11–51)

## 2018-11-08 MED ORDER — OXYCODONE-ACETAMINOPHEN 5-325 MG PO TABS
1.0000 | ORAL_TABLET | Freq: Once | ORAL | Status: AC
  Administered 2018-11-08: 1 via ORAL
  Filled 2018-11-08: qty 1

## 2018-11-08 MED ORDER — SODIUM CHLORIDE (PF) 0.9 % IJ SOLN
INTRAMUSCULAR | Status: AC
  Filled 2018-11-08: qty 50

## 2018-11-08 MED ORDER — SODIUM CHLORIDE 0.9 % IV BOLUS
1000.0000 mL | Freq: Once | INTRAVENOUS | Status: AC
  Administered 2018-11-08: 1000 mL via INTRAVENOUS

## 2018-11-08 MED ORDER — CIPROFLOXACIN HCL 500 MG PO TABS: 500.0000 mg | ORAL_TABLET | Freq: Two times a day (BID) | ORAL | 0 refills | Status: DC

## 2018-11-08 MED ORDER — IOHEXOL 300 MG/ML  SOLN
100.0000 mL | Freq: Once | INTRAMUSCULAR | Status: AC | PRN
  Administered 2018-11-08: 21:00:00 100 mL via INTRAVENOUS

## 2018-11-08 MED ORDER — ONDANSETRON HCL 4 MG/2ML IJ SOLN
4.0000 mg | Freq: Once | INTRAMUSCULAR | Status: AC
  Administered 2018-11-08: 19:00:00 4 mg via INTRAVENOUS
  Filled 2018-11-08: qty 2

## 2018-11-08 MED ORDER — METRONIDAZOLE 500 MG PO TABS: 500.0000 mg | ORAL_TABLET | Freq: Two times a day (BID) | ORAL | 0 refills | Status: DC

## 2018-11-08 MED ORDER — FENTANYL CITRATE (PF) 100 MCG/2ML IJ SOLN
100.0000 ug | INTRAMUSCULAR | Status: DC | PRN
  Administered 2018-11-08: 19:00:00 100 ug via INTRAVENOUS
  Filled 2018-11-08: qty 2

## 2018-11-08 MED ORDER — HYDROCODONE-ACETAMINOPHEN 5-325 MG PO TABS: 1.0000 | ORAL_TABLET | ORAL | 0 refills | Status: DC | PRN

## 2018-11-08 MED ORDER — SODIUM CHLORIDE 0.9% FLUSH: 3.0000 mL | Freq: Once | INTRAVENOUS | Status: DC

## 2018-11-08 NOTE — Discharge Instructions (Addendum)
Stay on a low fiber diet for now, and drink a lot of water.  We sent prescriptions to your pharmacy to start taking tomorrow.  Call the GI doctor for a follow-up appointment.  See your primary care doctor as needed for problems.

## 2018-11-08 NOTE — ED Provider Notes (Signed)
[facility] DEPT Provider Note   CSN: [id number] Arrival date & time: 11/08/18  1308     History   Chief Complaint Chief Complaint  Patient presents with  . Abdominal Pain  . Penis Pain    HPI Shane Arroyo is a 43 y.o. male.     HPI   He has been ill for several days with vomiting, diarrhea and bilateral lower quadrant abdominal pain.  He started taking some leftover Flagyl, Cipro and Protonix, several days ago.  He states that he commonly has to take some Flagyl and Cipro here and there for a day or so and that has been "a long time since I took 7 days worth."  He does not actively see GI.  He has history of diverticulitis.  He has felt warm did not check his temperature.  He denies shortness of breath, cough, chest pain, dizziness or weakness.  There are no other known modifying factors.  Past Medical History:  Diagnosis Date  . Diverticulitis   . GERD (gastroesophageal reflux disease)     There are no active problems to display for this patient.   Past Surgical History:  Procedure Laterality Date  . LUMBAR LAMINECTOMY/DECOMPRESSION MICRODISCECTOMY Right 11/03/2015   Procedure: RIGHT SIDED LUMBAR 5-SACRUM 1 MICRODISCECTOMY;  Surgeon: Phylliss Bob, MD;  Location: [facility];  Service: Orthopedics;  Laterality: Right;  RIGHT SIDED LUMBAR 5-SACRUM 1 MICRODISCECTOMY  . NO PAST SURGERIES    . WISDOM TOOTH EXTRACTION     2 taken out        Home Medications    Prior to Admission medications   Medication Sig Start Date End Date Taking? Authorizing Provider  azithromycin (ZITHROMAX) 250 MG tablet Take 4 tabs at once with food. Patient not taking: Reported on 03/06/2017 10/12/15   Tereasa Coop, PA-C  ciprofloxacin (CIPRO) 500 MG tablet Take 1 tablet (500 mg total) by mouth 2 (two) times daily. One po bid x 7 days 11/08/18   Daleen Bo, MD  HYDROcodone-acetaminophen [facility]) 5-325 MG tablet Take 1-2 tablets by mouth every 4 (four)  hours as needed for moderate pain. 11/08/18   Daleen Bo, MD  ibuprofen (ADVIL,MOTRIN) 400 MG tablet Take 400 mg by mouth 2 (two) times daily as needed for moderate pain.    [provider]  metroNIDAZOLE (FLAGYL) 500 MG tablet Take 1 tablet (500 mg total) by mouth 2 (two) times daily. One po bid x 7 days 11/08/18   Daleen Bo, MD  pantoprazole (PROTONIX) 20 MG tablet Take 20 mg by mouth daily. 02/07/17   [provider]    Family History Family History  Problem Relation Age of Onset  . Diabetes Father   . Cancer Maternal Grandmother   . Alcohol abuse Maternal Grandfather   . Diabetes Paternal Grandmother   . Diabetes Paternal Grandfather     Social History Social History   Tobacco Use  . Smoking status: Current Every Day Smoker    Packs/day: 0.50    Types: Cigarettes  . Smokeless tobacco: Never Used  Substance Use Topics  . Alcohol use: Yes    Comment: mainly on weekends  . Drug use: Yes    Types: Marijuana, Cocaine    Comment: last cocaine use 10/31/15, last marijuana use 11/01/15     Allergies   Patient has no known allergies.   Review of Systems Review of Systems  All other systems reviewed and are negative.    Physical Exam Updated Vital Signs  BP (!) 141/108 (BP Location: Right Arm)   Pulse 72   Temp 98.2 F (36.8 C) (Oral)   Resp 18   SpO2 98%   Physical Exam Vitals signs and nursing note reviewed.  Constitutional:      General: He is not in acute distress.    Appearance: He is well-developed. He is not ill-appearing, toxic-appearing or diaphoretic.  HENT:     Head: Normocephalic and atraumatic.     Right Ear: External ear normal.     Left Ear: External ear normal.  Eyes:     Conjunctiva/sclera: Conjunctivae normal.     Pupils: Pupils are equal, round, and reactive to light.  Neck:     Musculoskeletal: Normal range of motion and neck supple.     Trachea: Phonation normal.  Cardiovascular:     Rate and Rhythm: Normal  rate and regular rhythm.     Heart sounds: Normal heart sounds.  Pulmonary:     Effort: Pulmonary effort is normal.     Breath sounds: Normal breath sounds.  Abdominal:     Palpations: Abdomen is soft.     Tenderness: There is abdominal tenderness (Mild) in the right lower quadrant and left lower quadrant.  Musculoskeletal: Normal range of motion.  Skin:    General: Skin is warm and dry.  Neurological:     Mental Status: He is alert and oriented to person, place, and time.     Cranial Nerves: No cranial nerve deficit.     Sensory: No sensory deficit.     Motor: No abnormal muscle tone.     Coordination: Coordination normal.  Psychiatric:        Mood and Affect: Mood normal.        Behavior: Behavior normal.        Thought Content: Thought content normal.        Judgment: Judgment normal.      ED Treatments / Results  Labs (all labs ordered are listed, but only abnormal results are displayed) Labs Reviewed  COMPREHENSIVE METABOLIC PANEL - Abnormal; Notable for the following components:      Result Value   Total Protein 8.4 (*)    All other components within normal limits  CBC - Abnormal; Notable for the following components:   WBC 12.0 (*)    All other components within normal limits  URINALYSIS, ROUTINE W REFLEX MICROSCOPIC - Abnormal; Notable for the following components:   Specific Gravity, Urine >1.046 (*)    All other components within normal limits  LIPASE, BLOOD    EKG None  Radiology Ct Abdomen Pelvis W Contrast  Result Date: 11/08/2018 CLINICAL DATA:  Worsening abdominal and pelvic pain for 5 days. Previous diverticulitis. EXAM: CT ABDOMEN AND PELVIS WITH CONTRAST TECHNIQUE: Multidetector CT imaging of the abdomen and pelvis was performed using the standard protocol following bolus administration of intravenous contrast. CONTRAST:  173mL OMNIPAQUE IOHEXOL 300 MG/ML  SOLN COMPARISON:  03/06/2017 FINDINGS: Lower Chest: No acute findings. Hepatobiliary: No  hepatic masses identified. Gallbladder is unremarkable. No evidence of biliary ductal dilatation. Pancreas:  No mass or inflammatory changes. Spleen: Within normal limits in size and appearance. Adrenals/Urinary Tract: No masses identified. No evidence of hydronephrosis. Stomach/Bowel: Mild diverticulitis is seen involving the sigmoid colon. No evidence of extraluminal air or abscess. Vascular/Lymphatic: No pathologically enlarged lymph nodes. No abdominal aortic aneurysm. Reproductive:  No mass or other significant abnormality. Other:  None. Musculoskeletal:  No suspicious bone lesions identified. IMPRESSION: Mild sigmoid diverticulitis. No evidence  of abscess or other complication. Electronically Signed   By: Marlaine Hind M.D.   On: 11/08/2018 21:16    Procedures Procedures (including critical care time)  Medications Ordered in ED Medications  sodium chloride flush (NS) 0.9 % injection 3 mL (has no administration in time range)  fentaNYL (SUBLIMAZE) injection 100 mcg (100 mcg Intravenous Given 11/08/18 1920)  sodium chloride (PF) 0.9 % injection (has no administration in time range)  oxyCODONE-acetaminophen (PERCOCET/ROXICET) 5-325 MG per tablet 1 tablet (has no administration in time range)  ondansetron (ZOFRAN) injection 4 mg (4 mg Intravenous Given 11/08/18 1921)  sodium chloride 0.9 % bolus 1,000 mL (1,000 mLs Intravenous New Bag/Given 11/08/18 1923)  iohexol (OMNIPAQUE) 300 MG/ML solution 100 mL (100 mLs Intravenous Contrast Given 11/08/18 2038)     Initial Impression / Assessment and Plan / ED Course  I have reviewed the triage vital signs and the nursing notes.  Pertinent labs & imaging results that were available during my care of the patient were reviewed by me and considered in my medical decision making (see chart for details).  Clinical Course as of Nov 07 2328  Sat Nov 08, 2018  1852 Normal except protein slightly elevated  Comprehensive metabolic panel(!) [EW]  [id number] Normal   Lipase, blood [EW]  1852 Normal except for,  CBC(!) [EW]  2316 Normal except elevated specific gravity  Urinalysis, Routine w reflex microscopic(!) [EW]  2318 Per radiologist consistent with mild sigmoid diverticulitis.  No perforation or abscess.  CT Abdomen Pelvis W Contrast [EW]    Clinical Course User Index [EW] Daleen Bo, MD        Patient Vitals for the past 24 hrs:  BP Temp Temp src Pulse Resp SpO2  11/08/18 2219 (!) 141/108 - - 72 18 98 %  11/08/18 2000 (!) 128/93 - - 69 17 99 %  11/08/18 1830 104/70 - - 80 18 100 %  11/08/18 1328 (!) 154/104 98.2 F (36.8 C) Oral 69 18 98 %    11:18 PM Reevaluation with update and discussion. After initial assessment and treatment, an updated evaluation reveals he is comfortable now and states pain has improved.  He feels like he is ready to go home.  Discussed findings with the patient and the male with him, all questions answered. Daleen Bo   Medical Decision Making: Evaluation consistent with mild sigmoid diverticulitis, without signs of perforation, serious bacterial infection or metabolic instability.  He is stable for discharge with outpatient management.  He will be referred to GI.  CRITICAL CARE-no Performed by: Daleen Bo  Nursing Notes Reviewed/ Care Coordinated Applicable Imaging Reviewed Interpretation of Laboratory Data incorporated into ED treatment  The patient appears reasonably screened and/or stabilized for discharge and I doubt any other medical condition or other [facility] requiring further screening, evaluation, or treatment in the ED at this time prior to discharge.  Plan: Home Medications-OTC as needed; Home Treatments-low fiber diet with plenty of fluids; return here if the recommended treatment, does not improve the symptoms; Recommended follow up-PCP and GI follow-up 1 to 2 weeks.   Final Clinical Impressions(s) / ED Diagnoses   Final diagnoses:  Sigmoid diverticulitis    ED Discharge Orders          Ordered    ciprofloxacin (CIPRO) 500 MG tablet  2 times daily     11/08/18 2329    metroNIDAZOLE (FLAGYL) 500 MG tablet  2 times daily     11/08/18 2329    HYDROcodone-acetaminophen (NORCO) 5-325  MG tablet  Every 4 hours PRN     11/08/18 2329           Daleen Bo, MD 11/08/18 [phone]

## 2018-11-08 NOTE — ED Triage Notes (Signed)
Pt presents with c/o abdominal pain, pain in his penis, and pain in his rectum. Pt reports this pain has been present since Tuesday of this week, getting worse today. Pt reports that this is what normally happens when he has a bout of diverticulitis.

## 2019-02-23 ENCOUNTER — Ambulatory Visit
Admission: EM | Admit: 2019-02-23 | Discharge: 2019-02-23 | Disposition: A | Discharge status: Home / Self Care | Payer: Self-pay | Attending: Physician Assistant | Admitting: Physician Assistant

## 2019-02-23 DIAGNOSIS — R1032 Left lower quadrant pain: Secondary | ICD-10-CM

## 2019-02-23 DIAGNOSIS — K5792 Diverticulitis of intestine, part unspecified, without perforation or abscess without bleeding: Secondary | ICD-10-CM

## 2019-02-23 MED ORDER — IBUPROFEN 800 MG PO TABS: 800.0000 mg | ORAL_TABLET | Freq: Three times a day (TID) | ORAL | 0 refills | Status: DC

## 2019-02-23 MED ORDER — METRONIDAZOLE 500 MG PO TABS: 500.0000 mg | ORAL_TABLET | Freq: Two times a day (BID) | ORAL | 0 refills | Status: DC

## 2019-02-23 MED ORDER — CIPROFLOXACIN HCL 500 MG PO TABS: 500.0000 mg | ORAL_TABLET | Freq: Two times a day (BID) | ORAL | 0 refills | Status: DC

## 2019-02-23 NOTE — Discharge Instructions (Signed)
Start flagy and cipro as directed. Bland diet, advance as tolerated. If develop worsening symptoms, severe abdominal pain, fever, nausea/vomiting, blood in stool, go to the emergency department for further evaluation. Otherwise follow up with PCP/GI for further management needed.

## 2019-02-23 NOTE — ED Triage Notes (Signed)
Pt requesting med refill on Cipro, flagyl, and Protonix for his diverticulitis. Pt c/o flare up x3 days after eating chinese food.

## 2019-02-23 NOTE — ED Provider Notes (Signed)
[facility]    CSN: [id number] Arrival date & time: 02/23/19  1111      History   Chief Complaint Chief Complaint  Patient presents with  . Medication Refill    HPI Shane Arroyo is a 44 y.o. male.   44 year old male with history of diverticulitis, GERD comes in for 3-day history of left lower quadrant pain after eating [place] food.  States symptoms consistent with diverticulitis flare.  Pain is intermittent, and that is worse at times.  Had nausea with vomiting when symptoms first started, this is since resolved.  Has tried to avoid oral intake.  Denies fever, chills, body aches.  Has looser stools, but denies watery diarrhea.  Denies melena, hematochezia.  States abdominal pain can sometimes radiate to the back or to the groin area.  Denies any urinary symptoms such as frequency, dysuria, hematuria.  Had leftover Flagyl, which he has been taking once daily with some improvement of symptoms.     Past Medical History:  Diagnosis Date  . Diverticulitis   . GERD (gastroesophageal reflux disease)     There are no problems to display for this patient.   Past Surgical History:  Procedure Laterality Date  . LUMBAR LAMINECTOMY/DECOMPRESSION MICRODISCECTOMY Right 11/03/2015   Procedure: RIGHT SIDED LUMBAR 5-SACRUM 1 MICRODISCECTOMY;  Surgeon: Phylliss Bob, MD;  Location: [facility];  Service: Orthopedics;  Laterality: Right;  RIGHT SIDED LUMBAR 5-SACRUM 1 MICRODISCECTOMY  . NO PAST SURGERIES    . WISDOM TOOTH EXTRACTION     2 taken out       Home Medications    Prior to Admission medications   Medication Sig Start Date End Date Taking? Authorizing Provider  ciprofloxacin (CIPRO) 500 MG tablet Take 1 tablet (500 mg total) by mouth 2 (two) times daily. One po bid x 7 days 02/23/19   Ok Edwards, PA-C  HYDROcodone-acetaminophen (NORCO) 5-325 MG tablet Take 1-2 tablets by mouth every 4 (four) hours as needed for moderate pain. 11/08/18   Daleen Bo, MD    ibuprofen (ADVIL) 800 MG tablet Take 1 tablet (800 mg total) by mouth 3 (three) times daily. 02/23/19   Tasia Catchings, Dasja Brase V, PA-C  metroNIDAZOLE (FLAGYL) 500 MG tablet Take 1 tablet (500 mg total) by mouth 2 (two) times daily. One po bid x 7 days 02/23/19   Ok Edwards, PA-C  pantoprazole (PROTONIX) 20 MG tablet Take 20 mg by mouth daily. 02/07/17   [provider]    Family History Family History  Problem Relation Age of Onset  . Diabetes Father   . Cancer Maternal Grandmother   . Alcohol abuse Maternal Grandfather   . Diabetes Paternal Grandmother   . Diabetes Paternal Grandfather     Social History Social History   Tobacco Use  . Smoking status: Current Every Day Smoker    Packs/day: 0.50    Types: Cigarettes  . Smokeless tobacco: Never Used  Substance Use Topics  . Alcohol use: Yes    Comment: mainly on weekends  . Drug use: Yes    Types: Marijuana, Cocaine    Comment: last cocaine use 10/31/15, last marijuana use 11/01/15     Allergies   Patient has no known allergies.   Review of Systems Review of Systems  Reason unable to perform ROS: See HPI as above.     Physical Exam Triage Vital Signs ED Triage Vitals  Enc Vitals Group     BP 02/23/19 1128 (!) 156/112  Pulse Rate 02/23/19 1128 91     Resp 02/23/19 1128 18     Temp 02/23/19 1128 98.3 F (36.8 C)     Temp Source 02/23/19 1128 Oral     SpO2 02/23/19 1128 96 %     Weight --      Height --      Head Circumference --      Peak Flow --      Pain Score 02/23/19 1129 7     Pain Loc --      Pain Edu? --      Excl. in [place]? --    No data found.  Updated Vital Signs BP (!) 156/112 (BP Location: Left Arm)   Pulse 91   Temp 98.3 F (36.8 C) (Oral)   Resp 18   SpO2 96%   Physical Exam Constitutional:      General: He is not in acute distress.    Appearance: Normal appearance. He is not ill-appearing, toxic-appearing or diaphoretic.  HENT:     Head: Normocephalic and atraumatic.  Cardiovascular:      Rate and Rhythm: Normal rate and regular rhythm.  Pulmonary:     Effort: Pulmonary effort is normal. No respiratory distress.     Comments: LCTAB Abdominal:     General: Bowel sounds are normal.     Palpations: Abdomen is soft.     Tenderness: There is abdominal tenderness in the left lower quadrant. There is no right CVA tenderness, left CVA tenderness, guarding or rebound.  Musculoskeletal:     Cervical back: Normal range of motion and neck supple.  Skin:    General: Skin is warm and dry.  Neurological:     Mental Status: He is alert and oriented to person, place, and time.      UC Treatments / Results  Labs (all labs ordered are listed, but only abnormal results are displayed) Labs Reviewed - No data to display  EKG   Radiology No results found.  Procedures Procedures (including critical care time)  Medications Ordered in UC Medications - No data to display  Initial Impression / Assessment and Plan / UC Course  I have reviewed the triage vital signs and the nursing notes.  Pertinent labs & imaging results that were available during my care of the patient were reviewed by me and considered in my medical decision making (see chart for details).    Patient well-appearing, in no significant pain.  Without fever, and has not been able to tolerate oral intake.  Abdomen soft, with mild left lower quadrant tenderness on palpation.  No guarding or rebound. Lower suspicion for acute abdomen.  Will treat for diverticulitis flare with Cipro and Flagyl.  Discussed to follow-up with PCP/GI for monitoring and treatment if having frequent flareups.  Return precautions given.  Patient expresses understanding and agrees to plan.  Final Clinical Impressions(s) / UC Diagnoses   Final diagnoses:  Abdominal pain, left lower quadrant    ED Prescriptions    Medication Sig Dispense Auth. Provider   ciprofloxacin (CIPRO) 500 MG tablet Take 1 tablet (500 mg total) by mouth 2 (two)  times daily. One po bid x 7 days 14 tablet Norrine Ballester V, PA-C   metroNIDAZOLE (FLAGYL) 500 MG tablet Take 1 tablet (500 mg total) by mouth 2 (two) times daily. One po bid x 7 days 14 tablet Ravin Bendall V, PA-C   ibuprofen (ADVIL) 800 MG tablet Take 1 tablet (800 mg total) by mouth 3 (three)  times daily. 21 tablet Ok Edwards, PA-C     PDMP not reviewed this encounter.   Ok Edwards, PA-C 02/23/19 1345

## 2019-06-22 ENCOUNTER — Ambulatory Visit
Admission: EM | Admit: 2019-06-22 | Discharge: 2019-06-22 | Disposition: A | Discharge status: Home / Self Care | Payer: Self-pay | Attending: Emergency Medicine | Admitting: Emergency Medicine

## 2019-06-22 ENCOUNTER — Encounter: Payer: Self-pay | Admitting: Emergency Medicine

## 2019-06-22 ENCOUNTER — Other Ambulatory Visit: Payer: Self-pay

## 2019-06-22 DIAGNOSIS — Z8719 Personal history of other diseases of the digestive system: Secondary | ICD-10-CM

## 2019-06-22 DIAGNOSIS — R1032 Left lower quadrant pain: Secondary | ICD-10-CM

## 2019-06-22 MED ORDER — CIPROFLOXACIN HCL 500 MG PO TABS: 500.0000 mg | ORAL_TABLET | Freq: Two times a day (BID) | ORAL | 0 refills | Status: AC

## 2019-06-22 MED ORDER — METRONIDAZOLE 500 MG PO TABS: 500.0000 mg | ORAL_TABLET | Freq: Two times a day (BID) | ORAL | 0 refills | Status: DC

## 2019-06-22 NOTE — ED Provider Notes (Signed)
[facility]    CSN: [id number] Arrival date & time: 06/22/19  0949      History   Chief Complaint Chief Complaint  Patient presents with  . Abdominal Pain    HPI Shane Arroyo is a 44 y.o. male with history of GERD, diverticulitis presenting for RLQ pain with diarrhea since Friday.  Patient has history of sigmoid diverticulitis.  States it always hurts on the right side as well.  Denies history abdominal surgery.  No.  No local pain.  States this is preceded by drinking" eating what I want".  Patient last seen for this in urgent care setting in February of this year: Given ciprofloxacin, Flagyl which he tolerates well.  Has not been evaluated by specialist yet.  Denies hematochezia, melena, urinary symptoms, fever.   Past Medical History:  Diagnosis Date  . Diverticulitis   . GERD (gastroesophageal reflux disease)     There are no problems to display for this patient.   Past Surgical History:  Procedure Laterality Date  . LUMBAR LAMINECTOMY/DECOMPRESSION MICRODISCECTOMY Right 11/03/2015   Procedure: RIGHT SIDED LUMBAR 5-SACRUM 1 MICRODISCECTOMY;  Surgeon: Phylliss Bob, MD;  Location: [facility];  Service: Orthopedics;  Laterality: Right;  RIGHT SIDED LUMBAR 5-SACRUM 1 MICRODISCECTOMY  . NO PAST SURGERIES    . WISDOM TOOTH EXTRACTION     2 taken out       Home Medications    Prior to Admission medications   Medication Sig Start Date End Date Taking? Authorizing Provider  ciprofloxacin (CIPRO) 500 MG tablet Take 1 tablet (500 mg total) by mouth every 12 (twelve) hours for 7 days. 06/22/19 06/29/19  Hall-Potvin, [place], PA-C  metroNIDAZOLE (FLAGYL) 500 MG tablet Take 1 tablet (500 mg total) by mouth 2 (two) times daily. 06/22/19   Hall-Potvin, [place], PA-C    Family History Family History  Problem Relation Age of Onset  . Diabetes Father   . Cancer Maternal Grandmother   . Alcohol abuse Maternal Grandfather   . Diabetes Paternal Grandmother   .  Diabetes Paternal Grandfather     Social History Social History   Tobacco Use  . Smoking status: Current Every Day Smoker    Packs/day: 0.50    Types: Cigarettes  . Smokeless tobacco: Never Used  Substance Use Topics  . Alcohol use: Yes    Comment: mainly on weekends  . Drug use: Yes    Types: Marijuana, Cocaine    Comment: last cocaine use 10/31/15, last marijuana use 11/01/15     Allergies   Patient has no known allergies.   Review of Systems As per HPI   Physical Exam Triage Vital Signs ED Triage Vitals  Enc Vitals Group     BP      Pulse      Resp      Temp      Temp src      SpO2      Weight      Height      Head Circumference      Peak Flow      Pain Score      Pain Loc      Pain Edu?      Excl. in [place]?    No data found.  Updated Vital Signs BP (!) 153/99   Pulse 75   Temp (!) 97.5 F (36.4 C)   Resp 16   SpO2 97%   Visual Acuity Right Eye Distance:   Left Eye Distance:  Bilateral Distance:    Right Eye Near:   Left Eye Near:    Bilateral Near:     Physical Exam Constitutional:      General: He is not in acute distress. HENT:     Head: Normocephalic and atraumatic.  Eyes:     General: No scleral icterus.    Pupils: Pupils are equal, round, and reactive to light.  Cardiovascular:     Rate and Rhythm: Normal rate and regular rhythm.  Pulmonary:     Effort: Pulmonary effort is normal. No respiratory distress.     Breath sounds: No wheezing or rales.  Abdominal:     General: Bowel sounds are normal. There is no distension.     Palpations: Abdomen is soft.     Tenderness: There is abdominal tenderness in the left lower quadrant. There is no right CVA tenderness, left CVA tenderness, guarding or rebound. Negative signs include Murphy's sign, Rovsing's sign and McBurney's sign.  Skin:    Coloration: Skin is not jaundiced or pale.  Neurological:     Mental Status: He is alert and oriented to person, place, and time.      UC  Treatments / Results  Labs (all labs ordered are listed, but only abnormal results are displayed) Labs Reviewed - No data to display  EKG   Radiology No results found.  Procedures Procedures (including critical care time)  Medications Ordered in UC Medications - No data to display  Initial Impression / Assessment and Plan / UC Course  I have reviewed the triage vital signs and the nursing notes.  Pertinent labs & imaging results that were available during my care of the patient were reviewed by me and considered in my medical decision making (see chart for details).     Patient febrile, nontoxic in office today.  Per chart review, patient has had 2 ER visits since 2019 for sigmoid diverticulitis.  Was seen in urgent care setting this past February for diverticulitis flare.  States ciprofloxacin and Flagyl resolve symptoms and he tolerates this well.  Will treat empirically for diverticulitis given H&P and provide GI office information for further evaluation/management.  Return precautions discussed, patient verbalized understanding and is agreeable to plan. Final Clinical Impressions(s) / UC Diagnoses   Final diagnoses:  LLQ pain  History of diverticulitis     Discharge Instructions     Take antibiotics twice daily with food. Very important to follow-up with GI specialist for further evaluation and management. Recommend following up with PCP in 2 weeks. Go to ER for worsening pain, vomiting, black or blood in stool, fever.    ED Prescriptions    Medication Sig Dispense Auth. Provider   ciprofloxacin (CIPRO) 500 MG tablet Take 1 tablet (500 mg total) by mouth every 12 (twelve) hours for 7 days. 14 tablet Hall-Potvin, [place], PA-C   metroNIDAZOLE (FLAGYL) 500 MG tablet Take 1 tablet (500 mg total) by mouth 2 (two) times daily. 14 tablet Hall-Potvin, [place], PA-C     PDMP not reviewed this encounter.   Hall-Potvin, [place], [place] 06/22/19 1005

## 2019-06-22 NOTE — ED Triage Notes (Signed)
Pt c/o right lower abdominal pain and diarrhea since Friday, history of diverticulitis, states always hurts on the right lower side

## 2019-06-22 NOTE — Discharge Instructions (Addendum)
Take antibiotics twice daily with food. Very important to follow-up with GI specialist for further evaluation and management. Recommend following up with PCP in 2 weeks. Go to ER for worsening pain, vomiting, black or blood in stool, fever.

## 2019-06-30 NOTE — Progress Notes (Signed)
06/30/2019 Shane Arroyo [id number] Feb 06, 1975   CHIEF COMPLAINT: Recurrent diverticulitis   HISTORY OF PRESENT ILLNESS:  Shane Arroyo is a 44 year old male with a past medical history of recurrent sigmoid diverticulitis initially diagnosed in 2017. S/P lumbar laminectomy/decompression/microdiscectomy 10/2015. He presents to our office today as referred by [place] Hall-Potvin PA-C at [facility] urgent care for further evaluation regarding recurrent diverticulitis.  He was initially diagnosed with diverticulitis in 2017. He reported having intermittent RLQ pain and loose stools for almost 1 year before he was diagnosed with diverticulitis. In review of his medical records, he was diagnosed with sigmoid diverticulitis without abscess or perforation per CT imaging in 2017, 2018, 2019 and 2020 and 02/2019. He cannot count how many times he took Cipro and Flagyl x 7 days for diverticulitis symptoms, approximately 3 times yearly. He typically has a supply of Cipro and Flagyl which he takes for 3 or 4 days when his RLQ pain and loose stools occur. His worst episode of diverticulitis was 02/2019. At that time, he presented to [facility] ER for further evaluation. He was assessed to have LLQ tenderness on exam. Labs and an abd/pelvic CT were not done. He was treated with Cipro and Flagyl  500mg  po bid x 7 days. His most recent flare of diverticulitis occurred 06/22/2019. He reported having RLQ pain with loose stools x 3 days. He presented to [facility] urgent care on 06/22/2019. He was once again treated with Cipro and Flagyl 500mg  po bid x 7 days. He was advised to schedule a GI consult.  Currently, he denies having any lower abdominal pain. He skipped a few doses of Cipro and Flagyl therefore he will finish the prescribed 7 day course tomorrow. He stated his diverticulitis flares typically occur if he over eats or if he drinks too much alcohol and does not eat. No issues with constipation. During his  diverticulitis flares, his stools tend to be soft to loose. No watery diarrhea. He passes a normal brown formed stool when he does not have active diverticulitis. No rectal bleeding. He passed black stool in 2017 which occurred after he took Pepto bismol. No further black stools since then. He's never had a colonoscopy. He's never seen a Psychologist, sport and exercise. No family history of IBD or colon cancer. He had heartburn for a few days which abated. He infrequently takes Ibuprofen. He smokes marijuana daily. History of cocaine use, last used 2 days ago. No other complaints today.    Abdominal/Pelvic CT with contrast 11/08/2018: Mild sigmoid diverticulitis. No evidence of abscess or other Complication  Abdominal/Pelvic CT with contrast 03/06/2017: Mild acute sigmoid diverticulitis. No drainable fluid collection/abscess.  No free air.  Abdominal/Pelvic CT with contrast 04/09/2016: The study is positive for mild appearing sigmoid diverticulitis without abscess or perforation. No other acute abnormality.  Abdominal/Pelvic CT 06/12/2015: Sigmoid diverticulosis. Slight inflammatory stranding around the sigmoid colon compatible with early active diverticulitis  Social History: smokes cigarettes 1 pack every other day x 20+ years. Smokes marijuana daily x 20 years. Current cocaine use, last use 2 days ago. He drinks Tequila 3 to 4 shots on  Fri and Saturday evenings.   Family History: Mother age 89 healthy. Father age 41 history of DM. Stomach cancer maternal grandmother. Paternal grandmother and grandfather with history of DM. 11 siblings.  No family history of colon cancer.    No Known Allergies    Outpatient Encounter Medications as of 07/01/2019  Medication Sig  . metroNIDAZOLE (FLAGYL) 500  MG tablet Take 1 tablet (500 mg total) by mouth 2 (two) times daily.   No facility-administered encounter medications on file as of 07/01/2019.     REVIEW OF SYSTEMS: Gen: Denies fever, sweats or chills. No weight loss.    CV: Denies chest pain, palpitations or edema. Resp: Denies cough, shortness of breath of hemoptysis.  GI: See HPI. GU : Denies urinary burning, blood in urine, increased urinary frequency or incontinence. MS: Denies joint pain, muscles aches or weakness. Derm: Denies rash, itchiness, skin lesions or unhealing ulcers. Psych: Denies depression, anxiety, memory loss, suicidal ideation and confusion. Heme: Denies bruising, bleeding. Neuro:  Denies headaches, dizziness or paresthesias. Endo:  Denies any problems with DM, thyroid or adrenal function.   PHYSICAL EXAM: BP (!) 120/100 (BP Location: Left Arm, Patient Position: Sitting, Cuff Size: Normal)   Pulse 80   Ht 5' 6.5" (1.689 m) Comment: height measured without shoes  Wt 231 lb 8 oz (105 kg)   BMI 36.81 kg/m  General: Well developed  44 year old male in no acute distress. Head: Normocephalic and atraumatic. Eyes:  Sclerae non-icteric, conjunctive pink. Ears: Normal auditory acuity. Mouth: Dentition intact. No ulcers or lesions.  Neck: Supple, no lymphadenopathy or thyromegaly.  Lungs: Clear bilaterally to auscultation without wheezes, crackles or rhonchi. Heart: Regular rate and rhythm. No murmur, rub or gallop appreciated.  Abdomen: Soft, nontender, non distended. No masses. No hepatosplenomegaly. Normoactive bowel sounds x 4 quadrants.  Rectal: Deferred. Musculoskeletal: Symmetrical with no gross deformities. Skin: Warm and dry. No rash or lesions on visible extremities. Extremities: No edema. Neurological: Alert oriented x 4, no focal deficits.  Psychological:  Alert and cooperative. Normal mood and affect.  ASSESSMENT AND PLAN:  80. 44 year old male with a history of recurrent sigmoid diverticulitis since 2017 -Colonoscopy to rule out IBD, colon malignancy. Colonoscopy benefits and risks discussed including risk with sedation, risk of bleeding, perforation and infection  -Plan to refer patient to colorectal surgery for  future sigmoid resection after colonoscopy completed. Patient is at high risk for colon perforation due to young age of onset of diverticulitis with numerous recurrent episodes of diverticulitis. -Patient to call our office if his lower abdominal pain recurs -CBC, CMP and CRP -Advised patient no cocaine use for 2 weeks prior to colonoscopy         CC:  Pa, [facility]*

## 2019-07-01 ENCOUNTER — Ambulatory Visit (INDEPENDENT_AMBULATORY_CARE_PROVIDER_SITE_OTHER): Payer: Self-pay | Admitting: Nurse Practitioner

## 2019-07-01 ENCOUNTER — Encounter: Payer: Self-pay | Admitting: Nurse Practitioner

## 2019-07-01 ENCOUNTER — Other Ambulatory Visit (INDEPENDENT_AMBULATORY_CARE_PROVIDER_SITE_OTHER): Payer: Self-pay

## 2019-07-01 VITALS — BP 120/100 | HR 80 | Ht 66.5 in | Wt 231.5 lb

## 2019-07-01 DIAGNOSIS — K5732 Diverticulitis of large intestine without perforation or abscess without bleeding: Secondary | ICD-10-CM

## 2019-07-01 DIAGNOSIS — K5792 Diverticulitis of intestine, part unspecified, without perforation or abscess without bleeding: Secondary | ICD-10-CM

## 2019-07-01 LAB — CBC WITH DIFFERENTIAL/PLATELET
Basophils Absolute: 0.1 10*3/uL (ref 0.0–0.1)
Basophils Relative: 0.6 % (ref 0.0–3.0)
Eosinophils Absolute: 0.3 10*3/uL (ref 0.0–0.7)
Eosinophils Relative: 3.7 % (ref 0.0–5.0)
HCT: 47.8 % (ref 39.0–52.0)
Hemoglobin: 16.4 g/dL (ref 13.0–17.0)
Lymphocytes Relative: 24.5 % (ref 12.0–46.0)
Lymphs Abs: 2.2 10*3/uL (ref 0.7–4.0)
MCHC: 34.3 g/dL (ref 30.0–36.0)
MCV: 90.5 fl (ref 78.0–100.0)
Monocytes Absolute: 0.9 10*3/uL (ref 0.1–1.0)
Monocytes Relative: 9.7 % (ref 3.0–12.0)
Neutro Abs: 5.6 10*3/uL (ref 1.4–7.7)
Neutrophils Relative %: 61.5 % (ref 43.0–77.0)
Platelets: 217 10*3/uL (ref 150.0–400.0)
RBC: 5.28 Mil/uL (ref 4.22–5.81)
RDW: 14 % (ref 11.5–15.5)
WBC: 9.2 10*3/uL (ref 4.0–10.5)

## 2019-07-01 LAB — COMPREHENSIVE METABOLIC PANEL
ALT: 21 U/L (ref 0–53)
AST: 22 U/L (ref 0–37)
Albumin: 4.4 g/dL (ref 3.5–5.2)
Alkaline Phosphatase: 61 U/L (ref 39–117)
BUN: 14 mg/dL (ref 6–23)
CO2: 27 mEq/L (ref 19–32)
Calcium: 9.6 mg/dL (ref 8.4–10.5)
Chloride: 102 mEq/L (ref 96–112)
Creatinine, Ser: 1.08 mg/dL (ref 0.40–1.50)
GFR: 90.16 mL/min (ref >60.00)
Glucose, Bld: 87 mg/dL (ref 70–99)
Potassium: 4.1 mEq/L (ref 3.5–5.1)
Sodium: 136 mEq/L (ref 135–145)
Total Bilirubin: 0.6 mg/dL (ref 0.2–1.2)
Total Protein: 7.8 g/dL (ref 6.0–8.3)

## 2019-07-01 LAB — C-REACTIVE PROTEIN: CRP: 2 mg/dL (ref 0.5–20.0)

## 2019-07-01 NOTE — Patient Instructions (Signed)
If you are age 43 or older, your body mass index should be between 23-30. Your Body mass index is 36.81 kg/m. If this is out of the aforementioned range listed, please consider follow up with your Primary Care Provider.  If you are age 36 or younger, your body mass index should be between 19-25. Your Body mass index is 36.81 kg/m. If this is out of the aformentioned range listed, please consider follow up with your Primary Care Provider.   1. NO COCAINE USE FOR 2 WEEKS PRIOR TO YOUR COLONOSCOPY.  2. CALL OUR OFFICE IF YOUR ABDOMINAL PAIN RECURS.  3. WE WILL REFER YOU TO A GENERAL SURGEON AFTER THE COMPLETION OF YOUR COLONOSCOPY.  Your provider has requested that you go to the basement level for lab work before leaving today. Press "B" on the elevator. The lab is located at the first door on the left as you exit the elevator.  We have given you a sample prep of PLENVU for your colonoscopy.  Due to recent changes in healthcare laws, you may see the results of your imaging and laboratory studies on MyChart before your provider has had a chance to review them.  We understand that in some cases there may be results that are confusing or concerning to you. Not all laboratory results come back in the same time frame and the provider may be waiting for multiple results in order to interpret others.  Please give [place] 48 hours in order for your provider to thoroughly review all the results before contacting the office for clarification of your results.   Thank you for choosing [facility] Noralyn Pick, CRNP

## 2019-07-01 NOTE — Progress Notes (Signed)
Reviewed and agree with management plan.  Glorene Leitzke T. Lexianna Weinrich, MD FACG [facility]  

## 2019-07-01 NOTE — Addendum Note (Signed)
Addended by: Noralyn Pick on: 07/01/2019 01:01 PM   Modules accepted: Level of Service

## 2019-08-24 ENCOUNTER — Ambulatory Visit (INDEPENDENT_AMBULATORY_CARE_PROVIDER_SITE_OTHER): Payer: Self-pay

## 2019-08-24 DIAGNOSIS — Z1159 Encounter for screening for other viral diseases: Secondary | ICD-10-CM

## 2019-09-02 ENCOUNTER — Encounter: Payer: Self-pay | Admitting: Gastroenterology

## 2019-09-16 ENCOUNTER — Other Ambulatory Visit: Payer: Self-pay | Admitting: General Surgery

## 2019-09-16 ENCOUNTER — Encounter: Payer: Self-pay | Admitting: Gastroenterology

## 2019-09-16 DIAGNOSIS — K5732 Diverticulitis of large intestine without perforation or abscess without bleeding: Secondary | ICD-10-CM

## 2019-11-05 ENCOUNTER — Telehealth: Payer: Self-pay | Admitting: Nurse Practitioner

## 2019-11-05 ENCOUNTER — Other Ambulatory Visit: Payer: Self-pay

## 2019-11-05 MED ORDER — CIPROFLOXACIN HCL 500 MG PO TABS: 500.0000 mg | ORAL_TABLET | Freq: Two times a day (BID) | ORAL | 0 refills | Status: AC

## 2019-11-05 MED ORDER — METRONIDAZOLE 500 MG PO TABS: 500.0000 mg | ORAL_TABLET | Freq: Two times a day (BID) | ORAL | 0 refills | Status: AC

## 2019-11-05 NOTE — Telephone Encounter (Signed)
Spoke with the patient. He is going out of country to [place] He is concerned he could have a flare of diverticulitis while out of the country and would like to take antibiotics with him. He is presently without any abdominal complaints. Per Carl Best NP he may have Cipro and Flagyl to take with him. He is instructed that if he develops abdominal pain, he will seek medical attention. He will call as soon as [place] is [place] for a follow up appointment (due follow up) and especially if he has to use the antibiotics. He is instructed that he cannot have any alcohol when taking these medications. Patient agrees to these instructions.

## 2019-11-23 ENCOUNTER — Other Ambulatory Visit: Payer: Self-pay

## 2019-11-23 ENCOUNTER — Ambulatory Visit (AMBULATORY_SURGERY_CENTER): Payer: Self-pay | Admitting: *Deleted

## 2019-11-23 VITALS — Ht 66.0 in | Wt 225.0 lb

## 2019-11-23 DIAGNOSIS — K5732 Diverticulitis of large intestine without perforation or abscess without bleeding: Secondary | ICD-10-CM

## 2019-11-23 NOTE — Progress Notes (Addendum)
No egg or soy allergy known to patient  No issues with past sedation with any surgeries or procedures no intubation problems in the past  No FH of Malignant Hyperthermia No diet pills per patient No home 02 use per patient  No blood thinners per patient  Pt denies issues with constipation  No A fib or A flutter  EMMI video to pt or via [place] 19 guidelines implemented in [place] today with Pt and RN    Patient states he has Plenvu prep from office visit 06/2019.  Due to the COVID-19 pandemic we are asking patients to follow these guidelines. Please only bring one care partner. Please be aware that your care partner may wait in the car in the parking lot or if they feel like they will be too hot to wait in the car, they may wait in the lobby on the 4th floor. All care partners are required to wear a mask the entire time (we do not have any that we can provide them), they need to practice social distancing, and we will do a Covid check for all patient's and care partners when you arrive. Also we will check their temperature and your temperature. If the care partner waits in their car they need to stay in the parking lot the entire time and we will call them on their cell phone when the patient is ready for discharge so they can bring the car to the front of the building. Also all patient's will need to wear a mask into building.

## 2019-11-25 ENCOUNTER — Ambulatory Visit: Payer: Self-pay | Admitting: Nurse Practitioner

## 2019-12-07 ENCOUNTER — Other Ambulatory Visit: Payer: Self-pay

## 2019-12-07 ENCOUNTER — Other Ambulatory Visit: Payer: Self-pay | Admitting: Gastroenterology

## 2019-12-07 ENCOUNTER — Encounter: Payer: Self-pay | Admitting: Gastroenterology

## 2019-12-07 ENCOUNTER — Ambulatory Visit (AMBULATORY_SURGERY_CENTER): Payer: Self-pay | Admitting: Gastroenterology

## 2019-12-07 VITALS — BP 121/91 | HR 80 | Temp 97.5°F | Resp 20 | Ht 66.5 in | Wt 225.0 lb

## 2019-12-07 DIAGNOSIS — R933 Abnormal findings on diagnostic imaging of other parts of digestive tract: Secondary | ICD-10-CM

## 2019-12-07 DIAGNOSIS — K5732 Diverticulitis of large intestine without perforation or abscess without bleeding: Secondary | ICD-10-CM

## 2019-12-07 DIAGNOSIS — D123 Benign neoplasm of transverse colon: Secondary | ICD-10-CM

## 2019-12-07 MED ORDER — SODIUM CHLORIDE 0.9 % IV SOLN: 500.0000 mL | Freq: Once | INTRAVENOUS | Status: DC

## 2019-12-07 NOTE — Op Note (Signed)
[place] Patient Name: Shane Arroyo Procedure Date: 12/07/2019 3:21 PM MRN: [id number] Endoscopist: Ladene Artist , MD Age: 44 Referring MD:  Date of Birth: [birth year] Gender: Male Account #: [id number] Procedure:                Colonoscopy Indications:              Abnormal CT of the GI tract (colon), recurrent                            diverticulitis) Medicines:                Monitored Anesthesia Care Procedure:                Pre-Anesthesia Assessment:                           - Prior to the procedure, a History and Physical                            was performed, and patient medications and                            allergies were reviewed. The patient's tolerance of                            previous anesthesia was also reviewed. The risks                            and benefits of the procedure and the sedation                            options and risks were discussed with the patient.                            All questions were answered, and informed consent                            was obtained. Prior Anticoagulants: The patient has                            taken no previous anticoagulant or antiplatelet                            agents. ASA Grade Assessment: II - A patient with                            mild systemic disease. After reviewing the risks                            and benefits, the patient was deemed in                            satisfactory condition to undergo the procedure.  After obtaining informed consent, the colonoscope                            was passed under direct vision. Throughout the                            procedure, the patient's blood pressure, pulse, and                            oxygen saturations were monitored continuously. The                            Colonoscope was introduced through the anus and                            advanced to the the cecum, identified by                             appendiceal orifice and ileocecal valve. The                            ileocecal valve, appendiceal orifice, and rectum                            were photographed. The quality of the bowel                            preparation was good. The colonoscopy was performed                            without difficulty. The patient tolerated the                            procedure well. Scope In: 3:35:49 PM Scope Out: 3:52:11 PM Scope Withdrawal Time: 0 hours 13 minutes 44 seconds  Total Procedure Duration: 0 hours 16 minutes 22 seconds  Findings:                 The perianal and digital rectal examinations were                            normal.                           Two sessile polyps were found in the transverse                            colon. The polyps were 7 to 9 mm in size. These                            polyps were removed with a cold snare. Resection                            and retrieval were complete.  Scattered small-mouthed diverticula were found in                            the right colon. There was no evidence of                            diverticular bleeding.                           Many small-mouthed diverticula were found in the                            left colon, more concentrated in the sigmoid colon.                            There was evidence of diverticular spasm. There was                            no evidence of diverticular bleeding.                           Internal hemorrhoids were found during                            retroflexion. The hemorrhoids were small and Grade                            I (internal hemorrhoids that do not prolapse).                           The exam was otherwise without abnormality on                            direct and retroflexion views. Complications:            No immediate complications. Estimated blood loss:                            None. Estimated Blood  Loss:     Estimated blood loss: none. Impression:               - Two 7 to 9 mm polyps in the transverse colon,                            removed with a cold snare. Resected and retrieved.                           - Mild diverticulitis in the right colon.                           - Moderate diverticulosis in the left colon.                           - Internal hemorrhoids.                           -  The examination was otherwise normal on direct                            and retroflexion views. Recommendation:           - Repeat colonoscopy after studies are complete for                            surveillance based on pathology results.                           - Patient has a contact number available for                            emergencies. The signs and symptoms of potential                            delayed complications were discussed with the                            patient. Return to normal activities tomorrow.                            Written discharge instructions were provided to the                            patient.                           - High fiber diet long term.                           - Continue present medications.                           - Consider surgical referral for elective segmental                            colectomy for recurrent diverticulitis.                           - Await pathology results. Ladene Artist, MD 12/07/2019 3:59:01 PM This report has been signed electronically.

## 2019-12-07 NOTE — Progress Notes (Signed)
VS taken by [place] 

## 2019-12-07 NOTE — Progress Notes (Signed)
Pt's states no medical or surgical changes since previsit or office visit. 

## 2019-12-07 NOTE — Progress Notes (Signed)
A/ox3, pleased with MAC, report to RN 

## 2019-12-07 NOTE — Progress Notes (Signed)
Called to room to assist during endoscopic procedure.  Patient ID and intended procedure confirmed with present staff. Received instructions for my participation in the procedure from the performing physician.  

## 2019-12-07 NOTE — Patient Instructions (Signed)
Handouts given:  Polyps, Hemorrhoids, Diverticulosis, High Fiber Diet Start High Fiber diet long term Continue current medications Await pathology results  YOU HAD AN ENDOSCOPIC PROCEDURE TODAY AT [facility]:   Refer to the procedure report that was given to you for any specific questions about what was found during the examination.  If the procedure report does not answer your questions, please call your gastroenterologist to clarify.  If you requested that your care partner not be given the details of your procedure findings, then the procedure report has been included in a sealed envelope for you to review at your convenience later.  YOU SHOULD EXPECT: Some feelings of bloating in the abdomen. Passage of more gas than usual.  Walking can help get rid of the air that was put into your GI tract during the procedure and reduce the bloating. If you had a lower endoscopy (such as a colonoscopy or flexible sigmoidoscopy) you may notice spotting of blood in your stool or on the toilet paper. If you underwent a bowel prep for your procedure, you may not have a normal bowel movement for a few days.  Please Note:  You might notice some irritation and congestion in your nose or some drainage.  This is from the oxygen used during your procedure.  There is no need for concern and it should clear up in a day or so.  SYMPTOMS TO REPORT IMMEDIATELY:   Following lower endoscopy (colonoscopy or flexible sigmoidoscopy):  Excessive amounts of blood in the stool  Significant tenderness or worsening of abdominal pains  Swelling of the abdomen that is new, acute  Fever of 100F or higher For urgent or emergent issues, a gastroenterologist can be reached at any hour by calling [phone]. Do not use MyChart messaging for urgent concerns.   DIET:  We do recommend a small meal at first, but then you may proceed to your regular diet.  Drink plenty of fluids but you should avoid alcoholic  beverages for 24 hours.  ACTIVITY:  You should plan to take it easy for the rest of today and you should NOT DRIVE or use heavy machinery until tomorrow (because of the sedation medicines used during the test).    FOLLOW UP: Our staff will call the number listed on your records 48-72 hours following your procedure to check on you and address any questions or concerns that you may have regarding the information given to you following your procedure. If we do not reach you, we will leave a message.  We will attempt to reach you two times.  During this call, we will ask if you have developed any symptoms of COVID 19. If you develop any symptoms (ie: fever, flu-like symptoms, shortness of breath, cough etc.) before then, please call [phone].  If you test positive for Covid 19 in the 2 weeks post procedure, please call and report this information to [place].    If any biopsies were taken you will be contacted by phone or by letter within the next 1-3 weeks.  Please call us at [phone] if you have not heard about the biopsies in 3 weeks.   SIGNATURES/CONFIDENTIALITY: You and/or your care partner have signed paperwork which will be entered into your electronic medical record.  These signatures attest to the fact that that the information above on your After Visit Summary has been reviewed and is understood.  Full responsibility of the confidentiality of this discharge information lies with you and/or your care-partner.

## 2019-12-09 ENCOUNTER — Telehealth: Payer: Self-pay | Admitting: *Deleted

## 2019-12-09 NOTE — Telephone Encounter (Signed)
Covid-19 screening questions   Do you now or have you had a fever in the last 14 days?no  Do you have any respiratory symptoms of shortness of breath or cough now or in the last 14 days?no  Do you have any family members or close contacts with diagnosed or suspected Covid-19 in the past 14 days?no  Have you been tested for Covid-19 and found to be positive?no       Follow up Call-  Call back number 12/07/2019  Post procedure Call Back phone  # [phone]  Permission to leave phone message Yes  Some recent data might be hidden     Patient questions:  Do you have a fever, pain , or abdominal swelling? No. Pain Score  0 *  Have you tolerated food without any problems? Yes.    Have you been able to return to your normal activities? Yes.    Do you have any questions about your discharge instructions: Diet   No. Medications  No. Follow up visit  No.  Do you have questions or concerns about your Care? No.  Actions: * If pain score is 4 or above: No action needed, pain <4.

## 2019-12-25 ENCOUNTER — Telehealth: Payer: Self-pay | Admitting: Gastroenterology

## 2019-12-25 NOTE — Telephone Encounter (Signed)
No path result in Epic from 11/29. Please call pathology today to track down this overdue result.

## 2019-12-25 NOTE — Telephone Encounter (Signed)
Patient requesting path results.

## 2019-12-25 NOTE — Telephone Encounter (Signed)
Dr Fuller Plan this pt is calling for path results.  It looks like it is still pending from 11/29.  Please advise

## 2019-12-28 NOTE — Telephone Encounter (Signed)
I have a message out to [facility] for follow up. Will provide update as soon as possible.

## 2019-12-28 NOTE — Telephone Encounter (Signed)
The report has been retrieved and will be submitted into the patient's chart for review.  You will receive an emailed report also, Dr. Fuller Plan.

## 2019-12-28 NOTE — Telephone Encounter (Signed)
Sheri,  Please call the patient with his results. See messages regarding the pathology report not returning in Epic. It still is not in Epic and it needs to result in Epic for all users to access. We received an email copy of the path report late today and I forwarded it to you. 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 Recommend a surveillance colonoscopy in 7 years, 11/2026. Please apologize for the delay due to this technical problem.  Path letter not needed. MS

## 2019-12-29 ENCOUNTER — Telehealth: Payer: Self-pay

## 2019-12-29 NOTE — Telephone Encounter (Signed)
Attempting to contact patient to give path results from his 12/07/19 colonoscopy. No voice mail and patient has not read any of his My Chart messages. Will try again later. Recall in for 7 yr. surveillance colonoscopy. Routed to [facility] for F/U of path report in [place]

## 2019-12-29 NOTE — Telephone Encounter (Signed)
Reached patient by phone and gave path results from his colonoscopy. Apologized for the delay in getting the results to him.

## 2020-01-18 ENCOUNTER — Telehealth: Payer: Self-pay

## 2020-01-18 NOTE — Telephone Encounter (Signed)
Pt's colonoscopy was 12-07-19 and path was taken, sent tp [facility].  Pt called to get his path results and Dr. Fuller Plan or his CMA/Nurse called to get the results because in Epic the pathology results are not released under the column Patient Sharing.  So we are unable to view the completed pathology results in Epic.  Per Dr. Fuller Plan have Braxton Feathers, RN to look into this. I know that [facility]. Several times without resolution.  I aslo sent [facility] a staff noted.  01-09-20 I called [facility] Pathology [facility] and was able to Speak with Delsa Bern.  He reported that the completed path report was sent to Epic and it did "PDF did cross over". He aslo said he would resend the document now.  If it does not show up in Epic in 10-20 minutes, that the problem is in Epic and I should contact and what number to reach our  EMR to look into this?  Thank you

## 2020-01-19 NOTE — Telephone Encounter (Signed)
[facility] Pathology report (PDF) will not cross over into EPIC. [facility] states they have sent the report multiple times and they can see that it was sent. They state the problem is on the EPIC side.   Epic Ticket [id number] placed on 01/19/2020.

## 2020-01-26 ENCOUNTER — Telehealth: Payer: Self-pay | Admitting: Gastroenterology

## 2020-01-26 NOTE — Telephone Encounter (Signed)
error 

## 2020-02-26 ENCOUNTER — Other Ambulatory Visit: Payer: Self-pay

## 2020-02-26 ENCOUNTER — Emergency Department (HOSPITAL_COMMUNITY)
Admission: EM | Admit: 2020-02-26 | Discharge: 2020-02-27 | Disposition: A | Discharge status: Home / Self Care | Payer: Self-pay | Attending: Emergency Medicine | Admitting: Emergency Medicine

## 2020-02-26 ENCOUNTER — Emergency Department (HOSPITAL_COMMUNITY): Payer: Self-pay

## 2020-02-26 DIAGNOSIS — R600 Localized edema: Secondary | ICD-10-CM | POA: Insufficient documentation

## 2020-02-26 DIAGNOSIS — R062 Wheezing: Secondary | ICD-10-CM | POA: Insufficient documentation

## 2020-02-26 DIAGNOSIS — R0789 Other chest pain: Secondary | ICD-10-CM | POA: Insufficient documentation

## 2020-02-26 DIAGNOSIS — F17210 Nicotine dependence, cigarettes, uncomplicated: Secondary | ICD-10-CM | POA: Insufficient documentation

## 2020-02-26 DIAGNOSIS — Z7982 Long term (current) use of aspirin: Secondary | ICD-10-CM | POA: Insufficient documentation

## 2020-02-26 LAB — BASIC METABOLIC PANEL
Anion gap: 9 (ref 5–15)
BUN: 17 mg/dL (ref 6–20)
CO2: 28 mmol/L (ref 22–32)
Calcium: 8.8 mg/dL — ABNORMAL LOW (ref 8.9–10.3)
Chloride: 102 mmol/L (ref 98–111)
Creatinine, Ser: 1.03 mg/dL (ref 0.61–1.24)
GFR, Estimated: >60 mL/min (ref >60)
Glucose, Bld: 91 mg/dL (ref 70–99)
Potassium: 3.4 mmol/L — ABNORMAL LOW (ref 3.5–5.1)
Sodium: 139 mmol/L (ref 135–145)

## 2020-02-26 LAB — CBC
HCT: 45.5 % (ref 39.0–52.0)
Hemoglobin: 15.2 g/dL (ref 13.0–17.0)
MCH: 30.8 pg (ref 26.0–34.0)
MCHC: 33.4 g/dL (ref 30.0–36.0)
MCV: 92.3 fL (ref 80.0–100.0)
Platelets: 194 10*3/uL (ref 150–400)
RBC: 4.93 MIL/uL (ref 4.22–5.81)
RDW: 13 % (ref 11.5–15.5)
WBC: 7.8 10*3/uL (ref 4.0–10.5)
nRBC: 0 % (ref 0.0–0.2)

## 2020-02-26 LAB — TROPONIN I (HIGH SENSITIVITY)
Troponin I (High Sensitivity): 3 ng/L (ref <18)
Troponin I (High Sensitivity): 3 ng/L (ref <18)

## 2020-02-26 MED ORDER — FAMOTIDINE 20 MG PO TABS: 20.0000 mg | ORAL_TABLET | Freq: Two times a day (BID) | ORAL | 0 refills | Status: DC

## 2020-02-26 MED ORDER — PANTOPRAZOLE SODIUM 40 MG IV SOLR
40.0000 mg | Freq: Once | INTRAVENOUS | Status: AC
  Administered 2020-02-26: 40 mg via INTRAVENOUS
  Filled 2020-02-26: qty 40

## 2020-02-26 MED ORDER — ALUM & MAG HYDROXIDE-SIMETH 200-200-20 MG/5ML PO SUSP
30.0000 mL | Freq: Once | ORAL | Status: AC
  Administered 2020-02-26: 30 mL via ORAL
  Filled 2020-02-26: qty 30

## 2020-02-26 MED ORDER — LIDOCAINE VISCOUS HCL 2 % MT SOLN
15.0000 mL | Freq: Once | OROMUCOSAL | Status: AC
  Administered 2020-02-26: 15 mL via ORAL
  Filled 2020-02-26: qty 15

## 2020-02-26 MED ORDER — MORPHINE SULFATE (PF) 4 MG/ML IV SOLN
4.0000 mg | Freq: Once | INTRAVENOUS | Status: AC
  Administered 2020-02-26: 4 mg via INTRAVENOUS
  Filled 2020-02-26: qty 1

## 2020-02-26 MED ORDER — PANTOPRAZOLE SODIUM 20 MG PO TBEC: 20.0000 mg | DELAYED_RELEASE_TABLET | Freq: Every day | ORAL | 0 refills | Status: AC

## 2020-02-26 MED ORDER — FAMOTIDINE 20 MG PO TABS: 20.0000 mg | ORAL_TABLET | Freq: Two times a day (BID) | ORAL | 0 refills | Status: AC

## 2020-02-26 NOTE — Discharge Instructions (Addendum)
At this time there does not appear to be the presence of an emergent medical condition, however there is always the potential for conditions to change. Please read and follow the below instructions.  Please return to the Emergency Department immediately for any new or worsening symptoms. Please be sure to follow up with your Primary Care Provider within one week regarding your visit today; please call their office to schedule an appointment even if you are feeling better for a follow-up visit. Please start taking medications Pepcid and Protonix as prescribed to help with reflux related symptoms. You have been referred to the cardiologist group, heart care.  Please call their office to schedule follow-up appointment. Stop smoking as this raises your risk for multiple diseases.  Go to the nearest Emergency Department immediately if: You have fever or chills You have a cough that gets worse, or you cough up blood. You have very bad (severe) pain in your belly (abdomen). You pass out (faint). You have either of these for no clear reason: Sudden chest discomfort. Sudden discomfort in your arms, back, neck, or jaw. You have shortness of breath at any time. You suddenly start to sweat, or your skin gets clammy. You feel sick to your stomach (nauseous). You throw up (vomit). You suddenly feel lightheaded or dizzy. You feel very weak or tired. Your heart starts to beat fast, or it feels like it is skipping beats. You have any new/concerning or worsening of symptoms    Please read the additional information packets attached to your discharge summary.  Do not take your medicine if  develop an itchy rash, swelling in your mouth or lips, or difficulty breathing; call 911 and seek immediate emergency medical attention if this occurs.  You may review your lab tests and imaging results in their entirety on your MyChart account.  Please discuss all results of fully with your primary care provider and  other specialist at your follow-up visit.  Note: Portions of this text may have been transcribed using voice recognition software. Every effort was made to ensure accuracy; however, inadvertent computerized transcription errors may still be present.

## 2020-02-26 NOTE — ED Provider Notes (Signed)
[facility] DEPT Provider Note   CSN: [id number] Arrival date & time: 02/26/20  1902     History Chief Complaint  Patient presents with  . Chest Pain  . Arm Pain    Shane Arroyo is a 45 y.o. male history of GERD, obesity, diverticulitis, smoker.  Patient presents for chest pain onset 4 days ago, no clear inciting event, was sitting on couch when pain began.  He describes moderate intensity central chest pain as a sharp sensation radiating to his left shoulder occasionally worsens with exertion improves with rest and with aspirin.  He reports he is currently chest pain-free.  Denies fever/chills, headache, cough/hemoptysis, pleurisy, abdominal pain, vomiting, diarrhea, extremity swelling/color change, history of blood clot, exogenous hormone use, history of cancer, recent surgery/immobilization or any additional concerns  HPI     Past Medical History:  Diagnosis Date  . Diverticulitis   . GERD (gastroesophageal reflux disease)     Patient Active Problem List   Diagnosis Date Noted  . Diverticulitis of colon 07/01/2019    Past Surgical History:  Procedure Laterality Date  . LUMBAR LAMINECTOMY/DECOMPRESSION MICRODISCECTOMY Right 11/03/2015   Procedure: RIGHT SIDED LUMBAR 5-SACRUM 1 MICRODISCECTOMY;  Surgeon: Phylliss Bob, MD;  Location: [facility];  Service: Orthopedics;  Laterality: Right;  RIGHT SIDED LUMBAR 5-SACRUM 1 MICRODISCECTOMY  . WISDOM TOOTH EXTRACTION     2 taken out       Family History  Problem Relation Age of Onset  . Diabetes Father   . Stomach cancer Maternal Grandmother   . Alcohol abuse Maternal Grandfather   . Diabetes Paternal Grandmother   . Diabetes Paternal Grandfather   . Colon cancer Neg Hx   . Colon polyps Neg Hx   . Esophageal cancer Neg Hx   . Rectal cancer Neg Hx     Social History   Tobacco Use  . Smoking status: Current Every Day Smoker    Packs/day: 0.50    Types: Cigarettes  . Smokeless  tobacco: Never Used  Vaping Use  . Vaping Use: Never used  Substance Use Topics  . Alcohol use: Yes    Comment: mainly on weekends  . Drug use: Yes    Types: Marijuana, Cocaine    Comment: last cocaine used 2 weeks ago, marijuana 4 days ago    Home Medications Prior to Admission medications   Medication Sig Start Date End Date Taking? Authorizing Provider  aspirin EC 325 MG tablet Take 325 mg by mouth every 6 (six) hours as needed for moderate pain.   Yes [provider]  famotidine (PEPCID) 20 MG tablet Take 1 tablet (20 mg total) by mouth 2 (two) times daily. 02/26/20  Yes Nuala Alpha A, PA-C  ibuprofen (ADVIL) 800 MG tablet Take 800 mg by mouth every 6 (six) hours as needed for mild pain.   Yes [provider]  pantoprazole (PROTONIX) 20 MG tablet Take 1 tablet (20 mg total) by mouth daily. 02/26/20  Yes Nuala Alpha A, PA-C    Allergies    Patient has no known allergies.  Review of Systems   Review of Systems Ten systems are reviewed and are negative for acute change except as noted in the HPI  Physical Exam Updated Vital Signs BP (!) 130/95   Pulse 76   Temp 98.9 F (37.2 C) (Oral)   Resp 13   Ht 5\' 7"  (1.702 m)   Wt 104.3 kg   SpO2 97%   BMI 36.02 kg/m  Physical Exam Constitutional:      General: He is not in acute distress.    Appearance: Normal appearance. He is well-developed. He is not ill-appearing or diaphoretic.  HENT:     Head: Normocephalic and atraumatic.  Eyes:     General: Vision grossly intact. Gaze aligned appropriately.     Pupils: Pupils are equal, round, and reactive to light.  Neck:     Trachea: Trachea and phonation normal.  Cardiovascular:     Rate and Rhythm: Normal rate and regular rhythm.     Pulses:          Dorsalis pedis pulses are 2+ on the right side and 2+ on the left side.  Pulmonary:     Effort: Pulmonary effort is normal. No respiratory distress.     Breath sounds: Wheezing (Faint bilateral  expiratory wheezing) present.  Abdominal:     General: There is no distension.     Palpations: Abdomen is soft.     Tenderness: There is no abdominal tenderness. There is no guarding or rebound.  Musculoskeletal:        General: Normal range of motion.     Cervical back: Normal range of motion.     Comments: Trace bilateral lower extremity edema  Skin:    General: Skin is warm and dry.  Neurological:     Mental Status: He is alert.     GCS: GCS eye subscore is 4. GCS verbal subscore is 5. GCS motor subscore is 6.     Comments: Speech is clear and goal oriented, follows commands Major Cranial nerves without deficit, no facial droop Moves extremities without ataxia, coordination intact  Psychiatric:        Behavior: Behavior normal.     ED Results / Procedures / Treatments   Labs (all labs ordered are listed, but only abnormal results are displayed) Labs Reviewed  BASIC METABOLIC PANEL - Abnormal; Notable for the following components:      Result Value   Potassium 3.4 (*)    Calcium 8.8 (*)    All other components within normal limits  CBC  TROPONIN I (HIGH SENSITIVITY)  TROPONIN I (HIGH SENSITIVITY)    EKG EKG Interpretation  Date/Time:  Friday February 26 2020 19:28:41 EST Ventricular Rate:  72 PR Interval:    QRS Duration: 79 QT Interval:  355 QTC Calculation: 389 R Axis:   51 Text Interpretation: Sinus rhythm Confirmed by Lennice Sites [phone]) on 02/26/2020 8:30:18 PM   Radiology DG Chest 2 View  Result Date: 02/26/2020 CLINICAL DATA:  45 year old male with chest pain. EXAM: CHEST - 2 VIEW COMPARISON:  Chest radiograph dated 11/03/2015. FINDINGS: The heart size and mediastinal contours are within normal limits. Both lungs are clear. The visualized skeletal structures are unremarkable. IMPRESSION: No active cardiopulmonary disease. Electronically Signed   By: Anner Crete M.D.   On: 02/26/2020 19:42    Procedures Procedures   Medications Ordered in  ED Medications  pantoprazole (PROTONIX) injection 40 mg (40 mg Intravenous Given 02/26/20 2042)  morphine 4 MG/ML injection 4 mg (4 mg Intravenous Given 02/26/20 2054)  alum & mag hydroxide-simeth (MAALOX/MYLANTA) 200-200-20 MG/5ML suspension 30 mL (30 mLs Oral Given 02/26/20 2054)    And  lidocaine (XYLOCAINE) 2 % viscous mouth solution 15 mL (15 mLs Oral Given 02/26/20 2054)    ED Course  I have reviewed the triage vital signs and the nursing notes.  Pertinent labs & imaging results that were available during my care of  the patient were reviewed by me and considered in my medical decision making (see chart for details).    MDM Rules/Calculators/A&P                         Additional history obtained from: 1. Nursing notes from this visit. 2. Review of electronic medical records. ------------------------ I ordered, reviewed and interpreted labs which include: CBC within normal limits, no leukocytosis to suggest infectious process, no anemia. High-sensitivity troponin within normal limits x2 (3->3) BMP shows no emergent electrolyte derangement, AKI or gap.  CXR:  IMPRESSION:  No active cardiopulmonary disease.   EKG: Sinus rhythm Confirmed by Lennice Sites [phone]) on 02/26/2020 8:30:18 PM - Patient low risk by Rock Nephew criteria PERC negative.  Low suspicion for pulmonary embolism.  Work-up above is reassuring, doubt ACS, dissection, anemia, pneumonia or other emergent cardiopulmonary etiologies of patient's symptoms today.  Patient reported after initial evaluation he had some return of his chest pain, he reports this improved after receiving IV Protonix.  Patient also received GI cocktail and morphine during this visit.  He was reassessed he remains pain-free and would like discharge.  Patient does have risk factors including smoking and obesity.  Heart score today is less than 4.  Will refer to cardiology given risk factors however suspect patient's symptoms today are secondary to GERD.   Will start patient on Protonix/Pepcid and encourage GI follow-up as well.  Discussed smoking cessation with patient and was they were offerred resources to help stop.  Total time was 5 min CPT code 99406.   At this time there does not appear to be any evidence of an acute emergency medical condition and the patient appears stable for discharge with appropriate outpatient follow up. Diagnosis was discussed with patient who verbalizes understanding of care plan and is agreeable to discharge. I have discussed return precautions with patient who verbalizes understanding. Patient encouraged to follow-up with their PCP and cardiology.  All questions answered.  Patient's case discussed with Dr. Ronnald Nian who agrees with plan to discharge with follow-up.   Note: Portions of this report may have been transcribed using voice recognition software. Every effort was made to ensure accuracy; however, inadvertent computerized transcription errors may still be present. Final Clinical Impression(s) / ED Diagnoses Final diagnoses:  Atypical chest pain    Rx / DC Orders ED Discharge Orders         Ordered    pantoprazole (PROTONIX) 20 MG tablet  Daily        02/26/20 2339    famotidine (PEPCID) 20 MG tablet  2 times daily        02/26/20 2339    Ambulatory referral to Cardiology        02/26/20 2339           Gari Crown 02/26/20 [place], [place], DO 02/26/20 2357

## 2020-02-26 NOTE — ED Triage Notes (Signed)
Pt came in with c/o chest pain times two days. Pt points in over his L chest and states the pain is squeezing and radiating to L pinky. Pt states he last took ASA and Ibuprofen approx one hour ago. States relief initially but then it comes back shortly after

## 2020-03-03 ENCOUNTER — Other Ambulatory Visit: Payer: Self-pay

## 2020-03-03 ENCOUNTER — Emergency Department (HOSPITAL_COMMUNITY): Payer: Self-pay

## 2020-03-03 ENCOUNTER — Encounter (HOSPITAL_COMMUNITY): Payer: Self-pay

## 2020-03-03 ENCOUNTER — Emergency Department (HOSPITAL_COMMUNITY)
Admission: EM | Admit: 2020-03-03 | Discharge: 2020-03-03 | Disposition: A | Discharge status: Home / Self Care | Payer: Self-pay | Attending: Emergency Medicine | Admitting: Emergency Medicine

## 2020-03-03 DIAGNOSIS — M19012 Primary osteoarthritis, left shoulder: Secondary | ICD-10-CM | POA: Insufficient documentation

## 2020-03-03 DIAGNOSIS — F17210 Nicotine dependence, cigarettes, uncomplicated: Secondary | ICD-10-CM | POA: Insufficient documentation

## 2020-03-03 MED ORDER — HYDROCODONE-ACETAMINOPHEN 5-325 MG PO TABS
1.0000 | ORAL_TABLET | Freq: Once | ORAL | Status: AC
  Administered 2020-03-03: 1 via ORAL
  Filled 2020-03-03: qty 1

## 2020-03-03 MED ORDER — DICLOFENAC SODIUM 1 % EX GEL: 2.0000 g | Freq: Four times a day (QID) | CUTANEOUS | 0 refills | Status: AC

## 2020-03-03 MED ORDER — METHOCARBAMOL 500 MG PO TABS: 500.0000 mg | ORAL_TABLET | Freq: Two times a day (BID) | ORAL | 0 refills | Status: AC

## 2020-03-03 NOTE — ED Triage Notes (Signed)
Pt presents with c/o left arm pain. Pt reports he was seen last week for chest pain, not having any at this time, but concerned that maybe his pain in his arm is muscle-related.

## 2020-03-03 NOTE — Discharge Instructions (Signed)
Take the medication stop with your pain. Return to the ED if you start to experience worsening pain, injuries or falls, numbness in arms or legs, chest pain or shortness of breath.

## 2020-03-03 NOTE — ED Provider Notes (Signed)
[facility] DEPT Provider Note   CSN: [id number] Arrival date & time: 03/03/20  1259     History Chief Complaint  Patient presents with  . Arm Pain    Shane Arroyo is a 45 y.o. male with a past medical history of GERD presenting to the ED with a chief complaint of left shoulder pain.  States that symptoms began approximately 10 days ago.  Initially was due to his chest pressure but this has since resolved since being seen in the ER.  States that he now has aching pain in his left shoulder radiating down his left arm.  He feels that he may have strained a muscle.  Denies any prior fracture, dislocations or procedures in the area.  No numbness, weakness, recent injuries,, ongoing chest pain, shortness of breath.  HPI     Past Medical History:  Diagnosis Date  . Diverticulitis   . GERD (gastroesophageal reflux disease)     Patient Active Problem List   Diagnosis Date Noted  . Diverticulitis of colon 07/01/2019    Past Surgical History:  Procedure Laterality Date  . LUMBAR LAMINECTOMY/DECOMPRESSION MICRODISCECTOMY Right 11/03/2015   Procedure: RIGHT SIDED LUMBAR 5-SACRUM 1 MICRODISCECTOMY;  Surgeon: Phylliss Bob, MD;  Location: [facility];  Service: Orthopedics;  Laterality: Right;  RIGHT SIDED LUMBAR 5-SACRUM 1 MICRODISCECTOMY  . WISDOM TOOTH EXTRACTION     2 taken out       Family History  Problem Relation Age of Onset  . Diabetes Father   . Stomach cancer Maternal Grandmother   . Alcohol abuse Maternal Grandfather   . Diabetes Paternal Grandmother   . Diabetes Paternal Grandfather   . Colon cancer Neg Hx   . Colon polyps Neg Hx   . Esophageal cancer Neg Hx   . Rectal cancer Neg Hx     Social History   Tobacco Use  . Smoking status: Current Every Day Smoker    Packs/day: 0.50    Types: Cigarettes  . Smokeless tobacco: Never Used  Vaping Use  . Vaping Use: Never used  Substance Use Topics  . Alcohol use: Yes    Comment:  mainly on weekends  . Drug use: Yes    Types: Marijuana, Cocaine    Comment: last cocaine used 2 weeks ago, marijuana 4 days ago    Home Medications Prior to Admission medications   Medication Sig Start Date End Date Taking? Authorizing Provider  diclofenac Sodium (VOLTAREN) 1 % GEL Apply 2 g topically 4 (four) times daily. 03/03/20  Yes Khatri, Hina, PA-C  methocarbamol (ROBAXIN) 500 MG tablet Take 1 tablet (500 mg total) by mouth 2 (two) times daily. 03/03/20  Yes Khatri, Hina, PA-C  aspirin EC 325 MG tablet Take 325 mg by mouth every 6 (six) hours as needed for moderate pain.    [provider]  famotidine (PEPCID) 20 MG tablet Take 1 tablet (20 mg total) by mouth 2 (two) times daily. 02/26/20   Nuala Alpha A, PA-C  ibuprofen (ADVIL) 800 MG tablet Take 800 mg by mouth every 6 (six) hours as needed for mild pain.    [provider]  pantoprazole (PROTONIX) 20 MG tablet Take 1 tablet (20 mg total) by mouth daily. 02/26/20   Deliah Boston, PA-C    Allergies    Patient has no known allergies.  Review of Systems   Review of Systems  Constitutional: Negative for fever.  Respiratory: Negative for chest tightness.   Cardiovascular: Negative for  chest pain.  Musculoskeletal: Positive for arthralgias and myalgias.  Skin: Negative for wound.  Neurological: Negative for weakness.    Physical Exam Updated Vital Signs BP (!) 153/118 (BP Location: Right Arm)   Pulse 85   Temp 98.5 F (36.9 C) (Oral)   Resp 18   Ht 5\' 6"  (1.676 m)   Wt 104.3 kg   SpO2 100%   BMI 37.12 kg/m   Physical Exam Vitals and nursing note reviewed.  Constitutional:      General: He is not in acute distress.    Appearance: He is well-developed and well-nourished. He is not diaphoretic.  HENT:     Head: Normocephalic and atraumatic.  Eyes:     General: No scleral icterus.    Extraocular Movements: EOM normal.     Conjunctiva/sclera: Conjunctivae normal.  Cardiovascular:     Rate  and Rhythm: Normal rate and regular rhythm.     Heart sounds: Normal heart sounds.  Pulmonary:     Effort: Pulmonary effort is normal. No respiratory distress.     Breath sounds: Normal breath sounds.  Musculoskeletal:        General: Tenderness present.     Cervical back: Normal range of motion.     Comments: Pain with overhead reaching of the L shoulder.  No overlying erythema, edema or tenderness of the left shoulder joint.  2+ radial pulse noted bilaterally.  Normal sensation to light touch of bilateral upper extremities.  Strength 5/5 in bilateral upper extremities.  Skin:    Findings: No rash.  Neurological:     Mental Status: He is alert.  Psychiatric:        Mood and Affect: Mood and affect normal.     ED Results / Procedures / Treatments   Labs (all labs ordered are listed, but only abnormal results are displayed) Labs Reviewed - No data to display  EKG None  Radiology DG Shoulder Left  Result Date: 03/03/2020 CLINICAL DATA:  Posterior and superior left shoulder pain EXAM: LEFT SHOULDER - 2+ VIEW COMPARISON:  None. FINDINGS: Frontal, transscapular, and axillary views of the left shoulder are obtained. No fracture, subluxation, or dislocation. There is moderate glenohumeral and acromioclavicular joint osteoarthritis, with significant hypertrophic changes at the [facility] joint. Left chest is clear. IMPRESSION: 1. Moderate glenohumeral and acromioclavicular joint osteoarthritis. 2. No acute bony abnormality. Electronically Signed   By: Randa Ngo M.D.   On: 03/03/2020 15:18    Procedures Procedures   Medications Ordered in ED Medications  HYDROcodone-acetaminophen (NORCO/VICODIN) 5-325 MG per tablet 1 tablet (1 tablet Oral Given 03/03/20 1333)    ED Course  I have reviewed the triage vital signs and the nursing notes.  Pertinent labs & imaging results that were available during my care of the patient were reviewed by me and considered in my medical decision making (see  chart for details).    MDM Rules/Calculators/A&P                          45 year old male with past medical history of GERD presenting to the ED with a chief complaint of left shoulder pain.  Symptoms began about 10 days ago.  At the beginning he had associated chest pain which he was seen in the ED for.  This resolved.  He now has aching pain in his left shoulder that is worse with certain movements.  No prior fracture, dislocations or procedures in the area.  On exam patient  reports pain with overhead reaching of the left upper extremity.  No tenderness to palpation of the shoulder joint.  No overlying skin changes, no edema, erythema or warmth of joint.  Areas neurovascularly intact.  Normal strength and sensation noted.  X-ray shows findings consistent with osteoarthritis.  Suspect that this is the cause of the symptoms versus muscle strain.  Doubt infectious or vascular cause based on physical exam findings.  Will treat with NSAID gel, muscle relaxer and have him follow-up with PCP. Return precautions given.   Patient is hemodynamically stable, in NAD, and able to ambulate in the ED. Evaluation does not show pathology that would require ongoing emergent intervention or inpatient treatment. I explained the diagnosis to the patient. Pain has been managed and has no complaints prior to discharge. Patient is comfortable with above plan and is stable for discharge at this time. All questions were answered prior to disposition. Strict return precautions for returning to the ED were discussed. Encouraged follow up with PCP.   An After Visit Summary was printed and given to the patient.   Portions of this note were generated with [employer]. Dictation errors may occur despite best attempts at proofreading.  Final Clinical Impression(s) / ED Diagnoses Final diagnoses:  Osteoarthritis of left shoulder, unspecified osteoarthritis type    Rx / DC Orders ED Discharge Orders          Ordered    methocarbamol (ROBAXIN) 500 MG tablet  2 times daily        03/03/20 1531    diclofenac Sodium (VOLTAREN) 1 % GEL  4 times daily        03/03/20 1531           Delia Heady, PA-C 03/03/20 1534    Blanchie Dessert, MD 03/06/20 1326

## 2020-09-07 ENCOUNTER — Telehealth: Payer: Self-pay | Admitting: Gastroenterology

## 2020-09-07 MED ORDER — METRONIDAZOLE 500 MG PO TABS: 500.0000 mg | ORAL_TABLET | Freq: Two times a day (BID) | ORAL | 0 refills | Status: DC

## 2020-09-07 MED ORDER — CIPROFLOXACIN HCL 500 MG PO TABS: 500.0000 mg | ORAL_TABLET | Freq: Two times a day (BID) | ORAL | 0 refills | Status: DC

## 2020-09-07 NOTE — Telephone Encounter (Signed)
Patient notified He is advised will need to get ibuprofen rx from PCP or take OTC RX sent

## 2020-09-07 NOTE — Telephone Encounter (Signed)
Patient about to travel out of the country and is requesting a rx for Cipro and Metronidazole in the every he would have a diverticulitis flare.  He is currently doing very well with no GI complaints.

## 2020-09-07 NOTE — Telephone Encounter (Signed)
I believe patient is needing refills of the these medications due to a diverticulitis flare. Can you please call patient and triage Sheri. Thanks.

## 2020-09-07 NOTE — Telephone Encounter (Signed)
Cipro 500 mg po bid, #20, no refills Flagyl 500 mg po bid, #20, no refills

## 2020-09-07 NOTE — Telephone Encounter (Signed)
Inbound call from patient requesting refills for ciprofloxacin, metronidazole, and ibuprofen medications to be sent to [facility] in chart please.

## 2021-06-12 ENCOUNTER — Emergency Department (HOSPITAL_COMMUNITY): Payer: Self-pay

## 2021-06-12 ENCOUNTER — Other Ambulatory Visit: Payer: Self-pay

## 2021-06-12 ENCOUNTER — Emergency Department (HOSPITAL_COMMUNITY)
Admission: EM | Admit: 2021-06-12 | Discharge: 2021-06-12 | Disposition: A | Discharge status: Home / Self Care | Payer: Self-pay | Attending: Emergency Medicine | Admitting: Emergency Medicine

## 2021-06-12 ENCOUNTER — Encounter (HOSPITAL_COMMUNITY): Payer: Self-pay | Admitting: Emergency Medicine

## 2021-06-12 DIAGNOSIS — Y92007 Garden or yard of unspecified non-institutional (private) residence as the place of occurrence of the external cause: Secondary | ICD-10-CM | POA: Insufficient documentation

## 2021-06-12 DIAGNOSIS — W19XXXA Unspecified fall, initial encounter: Secondary | ICD-10-CM | POA: Insufficient documentation

## 2021-06-12 DIAGNOSIS — Z7902 Long term (current) use of antithrombotics/antiplatelets: Secondary | ICD-10-CM | POA: Insufficient documentation

## 2021-06-12 DIAGNOSIS — S82841A Displaced bimalleolar fracture of right lower leg, initial encounter for closed fracture: Secondary | ICD-10-CM | POA: Insufficient documentation

## 2021-06-12 MED ORDER — OXYCODONE-ACETAMINOPHEN 5-325 MG PO TABS: 1.0000 | ORAL_TABLET | ORAL | 0 refills | Status: AC | PRN

## 2021-06-12 MED ORDER — FENTANYL CITRATE PF 50 MCG/ML IJ SOSY
100.0000 ug | PREFILLED_SYRINGE | Freq: Once | INTRAMUSCULAR | Status: AC | PRN
  Administered 2021-06-12: 100 ug via INTRAVENOUS
  Filled 2021-06-12: qty 2

## 2021-06-12 MED ORDER — KETAMINE HCL 50 MG/5ML IJ SOSY
30.0000 mg | PREFILLED_SYRINGE | Freq: Once | INTRAMUSCULAR | Status: AC
  Administered 2021-06-12: 30 mg via INTRAVENOUS
  Filled 2021-06-12: qty 5

## 2021-06-12 MED ORDER — OXYCODONE-ACETAMINOPHEN 5-325 MG PO TABS
1.0000 | ORAL_TABLET | Freq: Once | ORAL | Status: AC
  Administered 2021-06-12: 1 via ORAL
  Filled 2021-06-12: qty 1

## 2021-06-12 MED ORDER — SODIUM CHLORIDE 0.9 % IV BOLUS
1000.0000 mL | Freq: Once | INTRAVENOUS | Status: AC
  Administered 2021-06-12: 1000 mL via INTRAVENOUS

## 2021-06-12 NOTE — Discharge Instructions (Signed)
Take the prescribed medication as directed.  Do not mix with alcohol.  Try to keep leg elevated to keep swelling down. Follow-up with [facility]-- Dr. Doran Durand or Dr. Kathaleen Bury.  Call in the morning to get this scheduled. Return to the ED for new or worsening symptoms.

## 2021-06-12 NOTE — ED Triage Notes (Signed)
Pt BIB [facility] from home, pt reports tripping over a hole and falling, denies hitting his head or LOC. +etoh use tonight. Given 68mg fentanyl and '4mg'$  zofran pta.

## 2021-06-12 NOTE — Progress Notes (Signed)
Orthopedic Tech Progress Note Patient Details:  Shane Arroyo 08-Mar-1975 [id number]  Ortho Devices Type of Ortho Device: Post (short leg) splint, Stirrup splint Ortho Device/Splint Location: rle Ortho Device/Splint Interventions: Ordered, Application, Adjustment  I applied posterior splint with stirrup post reduction. Post Interventions Patient Tolerated: Well Instructions Provided: Care of device, Adjustment of device  Karolee Stamps 06/12/2021, 2:47 AM

## 2021-06-12 NOTE — ED Notes (Signed)
Patient Alert and oriented to baseline. Stable and ambulatory to baseline. Patient verbalized understanding of the discharge instructions.  Patient belongings were taken by the patient.   

## 2021-06-12 NOTE — ED Provider Notes (Signed)
[facility] EMERGENCY DEPARTMENT Provider Note   CSN: [id number] Arrival date & time: 06/12/21  0030     History  Chief Complaint  Patient presents with   Fall   Ankle Pain    Shane Arroyo is a 46 y.o. male.  The history is provided by the patient and medical records.  Fall  Ankle Pain  46 y.o. M here with right ankle injury.  Was outside in yard with family, stepped in a hole and injured right ankle.  No head injury or LOC.  Does have some EtOH on board.  Given fentanyl and vomited once with EMS afterwards.  Given zofran just PTA as well.  Denies prior ankle injuries/surgeries.  Home Medications Prior to Admission medications   Medication Sig Start Date End Date Taking? Authorizing Provider  aspirin EC 325 MG tablet Take 325 mg by mouth every 6 (six) hours as needed for moderate pain.    [provider]  ciprofloxacin (CIPRO) 500 MG tablet Take 1 tablet (500 mg total) by mouth 2 (two) times daily. 09/07/20   Ladene Artist, MD  diclofenac Sodium (VOLTAREN) 1 % GEL Apply 2 g topically 4 (four) times daily. 03/03/20   Khatri, Hina, PA-C  famotidine (PEPCID) 20 MG tablet Take 1 tablet (20 mg total) by mouth 2 (two) times daily. 02/26/20   Nuala Alpha A, PA-C  ibuprofen (ADVIL) 800 MG tablet Take 800 mg by mouth every 6 (six) hours as needed for mild pain.    [provider]  methocarbamol (ROBAXIN) 500 MG tablet Take 1 tablet (500 mg total) by mouth 2 (two) times daily. 03/03/20   Khatri, Hina, PA-C  metroNIDAZOLE (FLAGYL) 500 MG tablet Take 1 tablet (500 mg total) by mouth 2 (two) times daily. 09/07/20   Ladene Artist, MD  pantoprazole (PROTONIX) 20 MG tablet Take 1 tablet (20 mg total) by mouth daily. 02/26/20   Deliah Boston, PA-C      Allergies    Patient has no known allergies.    Review of Systems   Review of Systems  Musculoskeletal:  Positive for arthralgias.  All other systems reviewed and are negative.  Physical  Exam Updated Vital Signs Pulse 94   Temp (!) 97.3 F (36.3 C) (Oral)   Resp 20   SpO2 97%   Physical Exam Vitals and nursing note reviewed.  Constitutional:      Appearance: He is well-developed.  HENT:     Head: Normocephalic and atraumatic.     Comments: No visible head trauma Eyes:     Conjunctiva/sclera: Conjunctivae normal.     Pupils: Pupils are equal, round, and reactive to light.  Cardiovascular:     Rate and Rhythm: Normal rate and regular rhythm.     Heart sounds: Normal heart sounds.  Pulmonary:     Effort: Pulmonary effort is normal. No respiratory distress.     Breath sounds: Normal breath sounds. No rhonchi.  Abdominal:     General: Bowel sounds are normal.     Palpations: Abdomen is soft.     Tenderness: There is no abdominal tenderness. There is no rebound.  Musculoskeletal:        General: Normal range of motion.     Cervical back: Normal range of motion.     Comments: Right ankle with deformity along distal tib/fib; locally tenderness, no deformity or tenderness along proximal lower leg; abrasion to right knee and right medial ankle but no open fracture; DP  pulse intact, moving toes normally  Skin:    General: Skin is warm and dry.  Neurological:     Mental Status: He is alert and oriented to person, place, and time.     Comments: Awake, alert, oriented    ED Results / Procedures / Treatments   Labs (all labs ordered are listed, but only abnormal results are displayed) Labs Reviewed - No data to display  EKG None  Radiology DG Tibia/Fibula Right  Result Date: 06/12/2021 CLINICAL DATA:  Deformity following fall. EXAM: RIGHT TIBIA AND FIBULA - 2 VIEW COMPARISON:  06/12/2021 FINDINGS: There is a comminuted fracture of the distal fibula with overlapping and lateral displacement of the distal fracture fragment. There is lateral displacement of the talus with respect of the tibial plafond. A medial malleolar displaced fractures also suspected. Soft tissue  swelling is noted about the ankle. IMPRESSION: Ankle fracture dislocation with lateral dislocation of the talus with respect to the tibial plafond. Displaced distal fibula and medial malleolus fractures. Electronically Signed   By: Brett Fairy M.D.   On: 06/12/2021 02:11   DG Ankle 2 Views Right  Result Date: 06/12/2021 CLINICAL DATA:  Post reduction images. EXAM: RIGHT ANKLE - 2 VIEW COMPARISON:  June 12, 2021 (12:36 a.m.) FINDINGS: The right ankle was imaged in a fiberglass cast with subsequently obscured osseous and soft tissue detail. Acute fracture deformities of the right lateral malleolus and right medial malleolus are seen with gross anatomic alignment. There is asymmetric widening of the right ankle mortise without evidence of dislocation. Diffuse soft tissue swelling is noted. IMPRESSION: Acute fractures of the right lateral malleolus and right medial malleolus with interval reduction of the lateral ankle dislocation seen on the prior study. Electronically Signed   By: Virgina Norfolk M.D.   On: 06/12/2021 03:02   DG Ankle Complete Right  Result Date: 06/12/2021 CLINICAL DATA:  Deformity after fall in the yard. EXAM: RIGHT ANKLE - COMPLETE 3+ VIEW COMPARISON:  None Available. FINDINGS: Ankle fracture dislocation. There is lateral displacement of the talus with respect to the tibial plafond. Distal fibular fracture at the level of the ankle mortise, displaced and comminuted. Small fracture fragment from the medial malleolus, displaced. The distal malleolar fractures remain aligned with the talus. Disruption of the ankle mortise. No convincing tibial tubercle component. Generalized soft tissue edema. IMPRESSION: Ankle fracture dislocation with lateral dislocation of the talus with respect to the tibial plafond. Displaced distal fibula and medial malleolus fractures. Electronically Signed   By: Keith Rake M.D.   On: 06/12/2021 01:11   CT Ankle Right Wo Contrast  Result Date:  06/12/2021 CLINICAL DATA:  Ankle trauma, fracture. EXAM: CT OF THE RIGHT ANKLE WITHOUT CONTRAST TECHNIQUE: Multidetector CT imaging of the right ankle was performed according to the standard protocol. Multiplanar CT image reconstructions were also generated. RADIATION DOSE REDUCTION: This exam was performed according to the departmental dose-optimization program which includes automated exposure control, adjustment of the mA and/or kV according to patient size and/or use of iterative reconstruction technique. COMPARISON:  06/12/2021. FINDINGS: Bones/Joint/Cartilage There is a comminuted fracture of the distal fibula with overlapping and lateral displacement of the distal fracture fragment. There is widening of the tibiotalar joint space medially with improved alignment. A bony density is noted adjacent to the talar dome, possible avulsion fracture of the medial malleolus. Degenerative changes are noted at the ankle and midfoot. There is a linear calcification along the dorsal aspect the anterior process of the talus a posterior  aspect of the tibia, possible small avulsion fractures. Ligaments Suboptimally assessed by CT. Muscles and Tendons No acute abnormality. Soft tissues Subcutaneous edema is noted. IMPRESSION: 1. Mild widening of the tibiotalar joint medially with overall improved alignment of the tibiotalar joint. 2. Comminuted fracture of the distal fibula with lateral displacement of the distal fracture fragment. 3. Bony density in the medial tibiotalar joint space, possible small avulsion fracture of the medial malleolus. 4. Bony densities along the dorsal aspect of the anterior process of the talus and posterior aspect of the posterior tibia, possible avulsion fractures. Electronically Signed   By: Brett Fairy M.D.   On: 06/12/2021 03:35    Procedures Reduction of dislocation  Date/Time: 06/12/2021 4:31 AM Performed by: Larene Pickett, PA-C Authorized by: Larene Pickett, PA-C  Consent: Verbal  consent obtained. Risks and benefits: risks, benefits and alternatives were discussed Consent given by: patient Patient understanding: patient states understanding of the procedure being performed Patient consent: the patient's understanding of the procedure matches consent given Procedure consent: procedure consent matches procedure scheduled Relevant documents: relevant documents present and verified Test results: test results available and properly labeled Imaging studies: imaging studies available Required items: required blood products, implants, devices, and special equipment available Patient identity confirmed: verbally with patient and arm band Time out: Immediately prior to procedure a "time out" was called to verify the correct patient, procedure, equipment, support staff and site/side marked as required.      Medications Ordered in ED Medications - No data to display  ED Course/ Medical Decision Making/ A&P                           Medical Decision Making Amount and/or Complexity of Data Reviewed Radiology: ordered.  Risk Prescription drug management.   46 year old M presenting to the ED with isolated right ankle injury.  He was in the yard with family, stepped into a hole and rolled his ankle.  No head injury or LOC.  Does have some EtOH on board.  He has obvious deformity of right ankle but DP pulse remains intact.  Abrasion to medial ankle but skin integrity is intact, no open fracture.  Remainder of leg is atraumatic, denies other injuries.  X-ray with bi-malleolar fracture/dislocation.   Will need reduction.  As he does have EtOH on board, chose to reduce with fentanyl and ketamine for analgesia only. Patient remained awake during procedure with stable VS, tolerated fairly well.  Fracture remains quite unstable, held in place while splinting and afterwards until hardened.  Repeat x-ray with improved alignment.  Will get CT of the ankle.  Spoke with on-call  orthopedist, Dr. Alvan Dame-- agrees with CT, patient will need to follow-up with Dr. Doran Durand or Dr. Roma Kayser this week, call office in the morning.  To remains NWB, ice, elevate.    Plan has been discussed with wife, she acknowledged understanding.  Patient groggy after procedure, will allow to metabolize.  6:08 AM Patient awake, alert, oriented.  Reviewed discharged plan with him and wife once more, he acknowledged understanding.  Stable for discharge.  Will return here for any new/acute changes.  Final Clinical Impression(s) / ED Diagnoses Final diagnoses:  Closed bimalleolar fracture of right ankle, initial encounter    Rx / DC Orders ED Discharge Orders          Ordered    oxyCODONE-acetaminophen (PERCOCET) 5-325 MG tablet  Every 4 hours PRN  06/12/21 0546              Larene Pickett, PA-C 06/12/21 7903    Quintella Reichert, MD 06/13/21 [phone]

## 2021-06-12 NOTE — Progress Notes (Signed)
Orthopedic Tech Progress Note Patient Details:  Shane Arroyo September 25, 1975 [id number]  Ortho Devices Type of Ortho Device: Crutches Ortho Device/Splint Location: rle Ortho Device/Splint Interventions: Ordered, Application, Adjustment  I did crutch training and the patient refused to demonstrate use of crutches saying I already know how. Post Interventions Patient Tolerated: Well Instructions Provided: Care of device, Adjustment of device  Karolee Stamps 06/12/2021, 7:12 AM

## 2021-06-16 ENCOUNTER — Encounter (HOSPITAL_COMMUNITY): Payer: Self-pay | Admitting: Orthopaedic Surgery

## 2021-06-16 DIAGNOSIS — S82851A Displaced trimalleolar fracture of right lower leg, initial encounter for closed fracture: Secondary | ICD-10-CM | POA: Insufficient documentation

## 2021-06-16 NOTE — Progress Notes (Signed)
  ERAS Protcol - n/a - c;ears 12:45 COVID TEST- n/a  Anesthesia review: n/a  -------------  SDW INSTRUCTIONS:  Your procedure is scheduled on June 12. Please report to [facility] Entrance "A" at 1245 P.M., and check in at the Admitting office. Call this number if you have problems the morning of surgery: [phone]   Remember: Do not eat after midnight the night before your surgery  You may drink clear liquids until 12:15 the day of your surgery.   Clear liquids allowed are: Water, Non-Citrus Juices (without pulp), Carbonated Beverages, Clear Tea, Black Coffee Only, and Gatorade   Medications to take morning of surgery with a sip of water include: metroNIDAZOLE (FLAGYL) oxyCODONE-acetaminophen (PERCOCET) pantoprazole (PROTONIX)    As of today, STOP taking any Aspirin (unless otherwise instructed by your surgeon), Aleve, Naproxen, Ibuprofen, Motrin, Advil, Goody's, BC's, all herbal medications, fish oil, and all vitamins.    The Morning of Surgery Do not wear jewelry Do not wear lotions, powders, colognes, or deodorant Do not bring valuables to the hospital. [facility] is not responsible for any belongings or valuables.  If you are a smoker, DO NOT Smoke 24 hours prior to surgery  If you wear a CPAP at night please bring your mask the morning of surgery   Remember that you must have someone to transport you home after your surgery, and remain with you for 24 hours if you are discharged the same day.  Please bring cases for contacts, glasses, hearing aids, dentures or bridgework because it cannot be worn into surgery.   Patients discharged the day of surgery will not be allowed to drive home.   Please shower the NIGHT BEFORE/MORNING OF SURGERY (use antibacterial soap like DIAL soap if possible). Wear comfortable clothes the morning of surgery. Oral Hygiene is also important to reduce your risk of infection.  Remember - BRUSH YOUR TEETH THE MORNING OF SURGERY WITH YOUR  REGULAR TOOTHPASTE  Patient denies shortness of breath, fever, cough and chest pain.

## 2021-06-18 NOTE — Discharge Instructions (Signed)
Shane Proch, MD [facility]  Please read the following information regarding your care after surgery.  Medications  You only need a prescription for the narcotic pain medicine (ex. oxycodone, Percocet, Norco).  All of the other medicines listed below are available over the counter. ? Aleve 2 pills twice a day for the first 3 days after surgery. ? acetominophen (Tylenol) 650 mg every 4-6 hours as you need for minor to moderate pain ? oxycodone as prescribed for severe pain  ? To help prevent blood clots, take aspirin (81 mg) twice daily for 42 days after surgery (or total duration of nonweightbearing).  You should also get up every hour while you are awake to move around.  Weight Bearing ? Do NOT bear any weight on the operated leg or foot. This means do NOT touch your surgical leg to the ground!  Cast / Splint / Dressing ? If you have a splint, do NOT remove this. Keep your splint, cast or dressing clean and dry.  Don't put anything (coat hanger, pencil, etc) down inside of it.  If it gets wet, call the office immediately to schedule an appointment for a cast change.  Swelling IMPORTANT: It is normal for you to have swelling where you had surgery. To reduce swelling and pain, keep at least 3 pillows under your leg so that your toes are above your nose and your heel is above the level of your hip.  It may be necessary to keep your foot or leg elevated for several weeks.  This is critical to helping your incisions heal and your pain to feel better.  Follow Up Call my office at [phone] when you are discharged from the hospital or surgery center to schedule an appointment to be seen 7-10 days after surgery.  Call my office at [phone] if you develop a fever >101.5 F, nausea, vomiting, bleeding from the surgical site or severe pain.     

## 2021-06-19 ENCOUNTER — Ambulatory Visit (HOSPITAL_COMMUNITY): Payer: Self-pay | Admitting: Anesthesiology

## 2021-06-19 ENCOUNTER — Encounter (HOSPITAL_COMMUNITY): Payer: Self-pay | Admitting: Orthopaedic Surgery

## 2021-06-19 ENCOUNTER — Ambulatory Visit (HOSPITAL_COMMUNITY)
Admission: RE | Admit: 2021-06-19 | Discharge: 2021-06-19 | Disposition: A | Discharge status: Home / Self Care | Payer: Self-pay | Attending: Orthopaedic Surgery | Admitting: Orthopaedic Surgery

## 2021-06-19 ENCOUNTER — Other Ambulatory Visit: Payer: Self-pay

## 2021-06-19 ENCOUNTER — Ambulatory Visit (HOSPITAL_BASED_OUTPATIENT_CLINIC_OR_DEPARTMENT_OTHER): Payer: Self-pay | Admitting: Anesthesiology

## 2021-06-19 ENCOUNTER — Encounter (HOSPITAL_COMMUNITY)
Admission: RE | Disposition: A | Discharge status: Home / Self Care | Payer: Self-pay | Source: Home / Self Care | Attending: Orthopaedic Surgery

## 2021-06-19 ENCOUNTER — Ambulatory Visit (HOSPITAL_COMMUNITY): Payer: Self-pay

## 2021-06-19 DIAGNOSIS — Z6836 Body mass index (BMI) 36.0-36.9, adult: Secondary | ICD-10-CM | POA: Insufficient documentation

## 2021-06-19 DIAGNOSIS — S82851A Displaced trimalleolar fracture of right lower leg, initial encounter for closed fracture: Secondary | ICD-10-CM

## 2021-06-19 DIAGNOSIS — X501XXA Overexertion from prolonged static or awkward postures, initial encounter: Secondary | ICD-10-CM | POA: Insufficient documentation

## 2021-06-19 DIAGNOSIS — Y9389 Activity, other specified: Secondary | ICD-10-CM | POA: Insufficient documentation

## 2021-06-19 DIAGNOSIS — Z01818 Encounter for other preprocedural examination: Secondary | ICD-10-CM

## 2021-06-19 DIAGNOSIS — F17200 Nicotine dependence, unspecified, uncomplicated: Secondary | ICD-10-CM | POA: Insufficient documentation

## 2021-06-19 DIAGNOSIS — K219 Gastro-esophageal reflux disease without esophagitis: Secondary | ICD-10-CM | POA: Insufficient documentation

## 2021-06-19 HISTORY — PX: ORIF ANKLE FRACTURE: SHX5408

## 2021-06-19 LAB — CBC
HCT: 44.4 % (ref 39.0–52.0)
Hemoglobin: 15.1 g/dL (ref 13.0–17.0)
MCH: 31.2 pg (ref 26.0–34.0)
MCHC: 34 g/dL (ref 30.0–36.0)
MCV: 91.7 fL (ref 80.0–100.0)
Platelets: 288 10*3/uL (ref 150–400)
RBC: 4.84 MIL/uL (ref 4.22–5.81)
RDW: 12.5 % (ref 11.5–15.5)
WBC: 9.3 10*3/uL (ref 4.0–10.5)
nRBC: 0 % (ref 0.0–0.2)

## 2021-06-19 LAB — COMPREHENSIVE METABOLIC PANEL
ALT: 15 U/L (ref 0–44)
AST: 18 U/L (ref 15–41)
Albumin: 3.4 g/dL — ABNORMAL LOW (ref 3.5–5.0)
Alkaline Phosphatase: 61 U/L (ref 38–126)
Anion gap: 11 (ref 5–15)
BUN: 14 mg/dL (ref 6–20)
CO2: 25 mmol/L (ref 22–32)
Calcium: 9.1 mg/dL (ref 8.9–10.3)
Chloride: 103 mmol/L (ref 98–111)
Creatinine, Ser: 0.99 mg/dL (ref 0.61–1.24)
GFR, Estimated: >60 mL/min (ref >60)
Glucose, Bld: 86 mg/dL (ref 70–99)
Potassium: 4.3 mmol/L (ref 3.5–5.1)
Sodium: 139 mmol/L (ref 135–145)
Total Bilirubin: 0.8 mg/dL (ref 0.3–1.2)
Total Protein: 7.8 g/dL (ref 6.5–8.1)

## 2021-06-19 LAB — SURGICAL PCR SCREEN
MRSA, PCR: NEGATIVE
Staphylococcus aureus: POSITIVE — AB

## 2021-06-19 SURGERY — OPEN REDUCTION INTERNAL FIXATION (ORIF) ANKLE FRACTURE
Anesthesia: General | Site: Ankle | Laterality: Right

## 2021-06-19 MED ORDER — PHENYLEPHRINE 80 MCG/ML (10ML) SYRINGE FOR IV PUSH (FOR BLOOD PRESSURE SUPPORT)
PREFILLED_SYRINGE | INTRAVENOUS | Status: AC
  Filled 2021-06-19: qty 10

## 2021-06-19 MED ORDER — MIDAZOLAM HCL 2 MG/2ML IJ SOLN: 1.0000 mg | Freq: Once | INTRAMUSCULAR | Status: AC

## 2021-06-19 MED ORDER — ONDANSETRON HCL 4 MG/2ML IJ SOLN
INTRAMUSCULAR | Status: AC
  Filled 2021-06-19: qty 6

## 2021-06-19 MED ORDER — LIDOCAINE 2% (20 MG/ML) 5 ML SYRINGE
INTRAMUSCULAR | Status: DC | PRN
  Administered 2021-06-19: 60 mg via INTRAVENOUS

## 2021-06-19 MED ORDER — SODIUM CHLORIDE 0.9 % IR SOLN
Status: DC | PRN
  Administered 2021-06-19: 1000 mL

## 2021-06-19 MED ORDER — PHENYLEPHRINE 80 MCG/ML (10ML) SYRINGE FOR IV PUSH (FOR BLOOD PRESSURE SUPPORT)
PREFILLED_SYRINGE | INTRAVENOUS | Status: DC | PRN
  Administered 2021-06-19 (×4): 80 ug via INTRAVENOUS

## 2021-06-19 MED ORDER — FENTANYL CITRATE (PF) 100 MCG/2ML IJ SOLN: 100.0000 ug | Freq: Once | INTRAMUSCULAR | Status: AC

## 2021-06-19 MED ORDER — PHENYLEPHRINE HCL-NACL 20-0.9 MG/250ML-% IV SOLN
INTRAVENOUS | Status: DC | PRN
  Administered 2021-06-19: 25 ug/min via INTRAVENOUS

## 2021-06-19 MED ORDER — CEFAZOLIN SODIUM-DEXTROSE 2-4 GM/100ML-% IV SOLN
2.0000 g | INTRAVENOUS | Status: AC
  Administered 2021-06-19: 2 g via INTRAVENOUS
  Filled 2021-06-19: qty 100

## 2021-06-19 MED ORDER — MIDAZOLAM HCL 2 MG/2ML IJ SOLN
INTRAMUSCULAR | Status: AC
Start: 2021-06-19 — End: ?
  Filled 2021-06-19: qty 2

## 2021-06-19 MED ORDER — DEXAMETHASONE SODIUM PHOSPHATE 10 MG/ML IJ SOLN
INTRAMUSCULAR | Status: DC | PRN
  Administered 2021-06-19: 10 mg via INTRAVENOUS

## 2021-06-19 MED ORDER — VANCOMYCIN HCL 500 MG IV SOLR
INTRAVENOUS | Status: DC | PRN
  Administered 2021-06-19: 500 mg via TOPICAL

## 2021-06-19 MED ORDER — DEXAMETHASONE SODIUM PHOSPHATE 10 MG/ML IJ SOLN
INTRAMUSCULAR | Status: AC
  Filled 2021-06-19: qty 3

## 2021-06-19 MED ORDER — LACTATED RINGERS IV SOLN: INTRAVENOUS | Status: DC

## 2021-06-19 MED ORDER — ONDANSETRON HCL 4 MG/2ML IJ SOLN
INTRAMUSCULAR | Status: DC | PRN
  Administered 2021-06-19: 4 mg via INTRAVENOUS

## 2021-06-19 MED ORDER — FENTANYL CITRATE (PF) 100 MCG/2ML IJ SOLN
INTRAMUSCULAR | Status: AC
  Administered 2021-06-19: 100 ug via INTRAVENOUS
  Filled 2021-06-19: qty 2

## 2021-06-19 MED ORDER — ACETAMINOPHEN 500 MG PO TABS
1000.0000 mg | ORAL_TABLET | Freq: Once | ORAL | Status: AC
Start: 2021-06-19 — End: 2021-06-19
  Administered 2021-06-19: 1000 mg via ORAL
  Filled 2021-06-19: qty 2

## 2021-06-19 MED ORDER — BUPIVACAINE-EPINEPHRINE (PF) 0.5% -1:200000 IJ SOLN
INTRAMUSCULAR | Status: DC | PRN
  Administered 2021-06-19: 30 mL via PERINEURAL
  Administered 2021-06-19: 15 mL via PERINEURAL

## 2021-06-19 MED ORDER — PROPOFOL 10 MG/ML IV BOLUS
INTRAVENOUS | Status: DC | PRN
  Administered 2021-06-19: 200 mg via INTRAVENOUS

## 2021-06-19 MED ORDER — FENTANYL CITRATE (PF) 250 MCG/5ML IJ SOLN
INTRAMUSCULAR | Status: DC | PRN
  Administered 2021-06-19 (×4): 25 ug via INTRAVENOUS

## 2021-06-19 MED ORDER — FENTANYL CITRATE (PF) 250 MCG/5ML IJ SOLN
INTRAMUSCULAR | Status: AC
  Filled 2021-06-19: qty 5

## 2021-06-19 MED ORDER — LIDOCAINE 2% (20 MG/ML) 5 ML SYRINGE
INTRAMUSCULAR | Status: AC
  Filled 2021-06-19: qty 10

## 2021-06-19 MED ORDER — VANCOMYCIN HCL 500 MG IV SOLR
INTRAVENOUS | Status: AC
Start: 2021-06-19 — End: ?
  Filled 2021-06-19: qty 10

## 2021-06-19 MED ORDER — CHLORHEXIDINE GLUCONATE 0.12 % MT SOLN
15.0000 mL | Freq: Once | OROMUCOSAL | Status: AC
  Administered 2021-06-19: 15 mL via OROMUCOSAL
  Filled 2021-06-19: qty 15

## 2021-06-19 MED ORDER — ROCURONIUM BROMIDE 10 MG/ML (PF) SYRINGE
PREFILLED_SYRINGE | INTRAVENOUS | Status: AC
  Filled 2021-06-19: qty 10

## 2021-06-19 MED ORDER — ACETAMINOPHEN 500 MG PO TABS
ORAL_TABLET | ORAL | Status: AC
  Filled 2021-06-19: qty 1

## 2021-06-19 MED ORDER — MIDAZOLAM HCL 2 MG/2ML IJ SOLN
INTRAMUSCULAR | Status: AC
  Administered 2021-06-19: 1 mg via INTRAVENOUS
  Filled 2021-06-19: qty 2

## 2021-06-19 MED ORDER — ORAL CARE MOUTH RINSE: 15.0000 mL | Freq: Once | OROMUCOSAL | Status: AC

## 2021-06-19 SURGICAL SUPPLY — 75 items
ANCH SUT 1 SHRT SM RGD INSRTR (Anchor) ×1 IMPLANT
ANCHOR SUT 1.45 SZ 1 SHORT (Anchor) ×1 IMPLANT
ANCHOR SUT KEITH ABD SZ2 STR (SUTURE) ×2 IMPLANT
APL PRP STRL LF DISP 70% ISPRP (MISCELLANEOUS) ×2
BAG COUNTER SPONGE SURGICOUNT (BAG) IMPLANT
BAG SPNG CNTER NS LX DISP (BAG)
BANDAGE ESMARK 6X9 LF (GAUZE/BANDAGES/DRESSINGS) ×1 IMPLANT
BIT DRILL 2.5X2.75 QC CALB (BIT) ×1 IMPLANT
BLADE LONG MED 31X9 (MISCELLANEOUS) ×2 IMPLANT
BLADE SURG 15 STRL LF DISP TIS (BLADE) ×2 IMPLANT
BLADE SURG 15 STRL SS (BLADE) ×2
BNDG CMPR 9X6 STRL LF SNTH (GAUZE/BANDAGES/DRESSINGS) ×1
BNDG COHESIVE 6X5 TAN ST LF (GAUZE/BANDAGES/DRESSINGS) ×2 IMPLANT
BNDG ELASTIC 4X5.8 VLCR STR LF (GAUZE/BANDAGES/DRESSINGS) ×2 IMPLANT
BNDG ELASTIC 6X5.8 VLCR STR LF (GAUZE/BANDAGES/DRESSINGS) ×2 IMPLANT
BNDG ESMARK 6X9 LF (GAUZE/BANDAGES/DRESSINGS) ×2
BNDG GAUZE DERMACEA FLUFF (GAUZE/BANDAGES/DRESSINGS) ×1
BNDG GAUZE DERMACEA FLUFF 4 (GAUZE/BANDAGES/DRESSINGS) IMPLANT
BNDG GAUZE ELAST 4 BULKY (GAUZE/BANDAGES/DRESSINGS) ×2 IMPLANT
BNDG GZE DERMACEA 4 6PLY (GAUZE/BANDAGES/DRESSINGS) ×1
CHLORAPREP W/TINT 26 (MISCELLANEOUS) ×4 IMPLANT
COVER SURGICAL LIGHT HANDLE (MISCELLANEOUS) ×2 IMPLANT
CUFF TOURN SGL QUICK 34 (TOURNIQUET CUFF) ×2
CUFF TRNQT CYL 34X4.125X (TOURNIQUET CUFF) ×1 IMPLANT
DRAPE C-ARM 42X120 X-RAY (DRAPES) IMPLANT
DRAPE C-ARMOR (DRAPES) ×2 IMPLANT
DRAPE U-SHAPE 47X51 STRL (DRAPES) ×2 IMPLANT
DRSG PAD ABDOMINAL 8X10 ST (GAUZE/BANDAGES/DRESSINGS) ×6 IMPLANT
ELECT REM PT RETURN 15FT ADLT (MISCELLANEOUS) ×2 IMPLANT
FIXATION ZIPTIGHT ANKLE SNDSMS (Ankle) IMPLANT
GAUZE SPONGE 4X4 12PLY STRL (GAUZE/BANDAGES/DRESSINGS) ×3 IMPLANT
GAUZE XEROFORM 5X9 LF (GAUZE/BANDAGES/DRESSINGS) ×2 IMPLANT
GLOVE SURG ENC MOIS LTX SZ7.5 (GLOVE) ×4 IMPLANT
GLOVE SURG MICRO LTX SZ7.5 (GLOVE) ×4 IMPLANT
GLOVE SURG POLYISO LF SZ7.5 (GLOVE) ×2 IMPLANT
GLOVE SURG UNDER POLY LF SZ7.5 (GLOVE) ×4 IMPLANT
GOWN STRL REUS W/ TWL LRG LVL3 (GOWN DISPOSABLE) ×1 IMPLANT
GOWN STRL REUS W/TWL LRG LVL3 (GOWN DISPOSABLE) ×2
K-WIRE ACE 1.6X6 (WIRE) ×8
KIT BASIN OR (CUSTOM PROCEDURE TRAY) ×2 IMPLANT
KIT TURNOVER KIT A (KITS) IMPLANT
KWIRE ACE 1.6X6 (WIRE) IMPLANT
NDL MAYO CATGUT SZ4 TPR NDL (NEEDLE) IMPLANT
NEEDLE MAYO CATGUT SZ4 (NEEDLE) IMPLANT
NS IRRIG 1000ML POUR BTL (IV SOLUTION) ×2 IMPLANT
PACK ORTHO EXTREMITY (CUSTOM PROCEDURE TRAY) ×2 IMPLANT
PAD CAST 4YDX4 CTTN HI CHSV (CAST SUPPLIES) ×6 IMPLANT
PADDING CAST COTTON 4X4 STRL (CAST SUPPLIES) ×12
PADDING CAST COTTON 6X4 STRL (CAST SUPPLIES) ×2 IMPLANT
PADDING CAST SYN 6 (CAST SUPPLIES) ×4
PADDING CAST SYNTHETIC 4 (CAST SUPPLIES) ×4
PADDING CAST SYNTHETIC 4X4 STR (CAST SUPPLIES) IMPLANT
PADDING CAST SYNTHETIC 6X4 NS (CAST SUPPLIES) IMPLANT
PLATE LOCK 7H 92 BILAT FIB (Plate) ×1 IMPLANT
PUTTY DBX 2.5CC (Putty) ×2 IMPLANT
PUTTY DBX 2.5CC DEPUY (Putty) IMPLANT
SCREW LOCK 3.5X16 DIST TIB (Screw) ×1 IMPLANT
SCREW LOCK CORT STAR 3.5X14 (Screw) ×1 IMPLANT
SCREW LOCK CORT STAR 3.5X16 (Screw) ×1 IMPLANT
SCREW LOW PROFILE 18MMX3.5MM (Screw) ×1 IMPLANT
SPONGE T-LAP 18X18 ~~LOC~~+RFID (SPONGE) ×2 IMPLANT
STOCKINETTE TUBULAR 6 INCH (GAUZE/BANDAGES/DRESSINGS) ×2 IMPLANT
STOCKINETTE TUBULAR COTT 4X25 (GAUZE/BANDAGES/DRESSINGS) ×2 IMPLANT
STRIP CLOSURE SKIN 1/2X4 (GAUZE/BANDAGES/DRESSINGS) ×2 IMPLANT
SUCTION FRAZIER HANDLE 10FR (MISCELLANEOUS) ×2
SUCTION TUBE FRAZIER 10FR DISP (MISCELLANEOUS) ×1 IMPLANT
SUT ETHILON 3 0 PS 1 (SUTURE) ×3 IMPLANT
SUT MON AB 3-0 SH 27 (SUTURE) ×2
SUT MON AB 3-0 SH27 (SUTURE) ×1 IMPLANT
SUT VIC AB 2-0 SH 27 (SUTURE) ×2
SUT VIC AB 2-0 SH 27XBRD (SUTURE) ×1 IMPLANT
SUT VIC AB 3-0 SH 27 (SUTURE) ×4
SUT VIC AB 3-0 SH 27X BRD (SUTURE) ×2 IMPLANT
WATER STERILE IRR 1000ML POUR (IV SOLUTION) ×2 IMPLANT
ZIPTIGHT ANKLE SYNODESMOSS FIX (Ankle) ×2 IMPLANT

## 2021-06-19 NOTE — Anesthesia Procedure Notes (Signed)
Procedure Name: LMA Insertion Date/Time: 06/19/2021 4:25 PM  Performed by: Carolan Clines, CRNAPre-anesthesia Checklist: Patient identified, Emergency Drugs available, Suction available and Patient being monitored Patient Re-evaluated:Patient Re-evaluated prior to induction Oxygen Delivery Method: Circle System Utilized Preoxygenation: Pre-oxygenation with 100% oxygen Induction Type: IV induction LMA: LMA inserted LMA Size: 4.0 Number of attempts: 1 Airway Equipment and Method: Bite block Placement Confirmation: positive ETCO2 Tube secured with: Tape Dental Injury: Teeth and Oropharynx as per pre-operative assessment

## 2021-06-19 NOTE — Transfer of Care (Signed)
Immediate Anesthesia Transfer of Care Note  Patient: Shane Arroyo  Procedure(s) Performed: OPEN REDUCTION INTERNAL FIXATION (ORIF) ANKLE FRACTURE (Right: Ankle)  Patient Location: PACU  Anesthesia Type:GA combined with regional for post-op pain  Level of Consciousness: awake, alert  and oriented  Airway & Oxygen Therapy: Patient Spontanous Breathing  Post-op Assessment: Report given to RN and Post -op Vital signs reviewed and stable  Post vital signs: Reviewed and stable  Last Vitals:  Vitals Value Taken Time  BP 132/106 06/19/21 1857  Temp    Pulse 99 06/19/21 1858  Resp 15 06/19/21 1858  SpO2 99 % 06/19/21 1858  Vitals shown include unvalidated device data.  Last Pain:  Vitals:   06/19/21 1223  PainSc: 8       Patients Stated Pain Goal: 3 (88/82/80 0349)  Complications: No notable events documented.

## 2021-06-19 NOTE — Anesthesia Preprocedure Evaluation (Addendum)
Anesthesia Evaluation  Patient identified by MRN, date of birth, ID band Patient awake    Reviewed: Allergy & Precautions, H&P , NPO status , Patient's Chart, lab work & pertinent test results  Airway Mallampati: II  TM Distance: >3 FB Neck ROM: Full    Dental no notable dental hx. (+) Teeth Intact, Dental Advisory Given   Pulmonary neg pulmonary ROS, Current SmokerPatient did not abstain from smoking.,    Pulmonary exam normal breath sounds clear to auscultation       Cardiovascular negative cardio ROS   Rhythm:Regular Rate:Normal     Neuro/Psych negative neurological ROS  negative psych ROS   GI/Hepatic Neg liver ROS, GERD  Medicated,  Endo/Other  Morbid obesity  Renal/GU negative Renal ROS  negative genitourinary   Musculoskeletal   Abdominal   Peds  Hematology negative hematology ROS (+)   Anesthesia Other Findings   Reproductive/Obstetrics negative OB ROS                            Anesthesia Physical Anesthesia Plan  ASA: 2  Anesthesia Plan: General   Post-op Pain Management: Regional block* and Tylenol PO (pre-op)*   Induction: Intravenous  PONV Risk Score and Plan: 2 and Ondansetron, Dexamethasone and Midazolam  Airway Management Planned: LMA  Additional Equipment:   Intra-op Plan:   Post-operative Plan: Extubation in OR  Informed Consent: I have reviewed the patients History and Physical, chart, labs and discussed the procedure including the risks, benefits and alternatives for the proposed anesthesia with the patient or authorized representative who has indicated his/her understanding and acceptance.     Dental advisory given  Plan Discussed with: CRNA  Anesthesia Plan Comments:         Anesthesia Quick Evaluation

## 2021-06-19 NOTE — Op Note (Addendum)
06/19/2021  9:26 PM   PATIENT: Shane Arroyo  46 y.o. male  MRN: [id number]   PRE-OPERATIVE DIAGNOSIS:   Closed trimalleolar fracture of right ankle   POST-OPERATIVE DIAGNOSIS:   Closed trimalleolar fracture of right ankle   PROCEDURE: Open reduction internal fixation of the right distal fibula Open reduction internal fixation of the right ankle syndesmosis Open reduction and repair of the right ankle deltoid ligament    SURGEON:  Armond Hang, MD   ASSISTANT: None   ANESTHESIA: General, regional   EBL: Minimal   TOURNIQUET:    Total Tourniquet Time Documented: Thigh (Right) - 117 minutes Total: Thigh (Right) - 702 minutes    COMPLICATIONS: None apparent   DISPOSITION: Extubated, awake and stable to recovery.   INDICATION FOR PROCEDURE: The patient presented with right trimalleolar ankle fracture with extensively comminuted and shortened distal fibula with avulsion injuries of posterior and medial malleolus.  We discussed the diagnosis, alternative treatment options, risks and benefits of the above surgical intervention, as well as alternative non-operative treatments. All questions/concerns were addressed and the patient/family demonstrated appropriate understanding of the diagnosis, the procedure, the postoperative course, and overall prognosis. The patient wished to proceed with surgical intervention and signed an informed surgical consent as such, in each others presence prior to surgery.   PROCEDURE IN DETAIL: After preoperative consent was obtained and the correct operative site was identified, the patient was brought to the operating room supine on stretcher and transferred onto operating table. General anesthesia was induced. Preoperative antibiotics were administered. Surgical timeout was taken. The patient was then positioned supine with an ipsilateral hip bump. The operative lower extremity was prepped and draped in standard sterile fashion  with a tourniquet around the thigh. The extremity was exsanguinated and the tourniquet was inflated to 275 mmHg.  A standard lateral incision was made over the distal fibula. Dissection was carried down to the level of the fibula and the fracture site identified. The superficial peroneal nerve was identified and protected throughout the procedure. The fibula was noted to be shortened by 2 cm with interposed periosteum. It was noted that the anterior aspect of the proximal and distal fragments of the fibula were impacted and telescoped into each other at the fracture site. This resulted in a huge amount of comminuted and bone loss. Some of these bone fragments were preserved and later used as autograft to fill the bone void after plate fixation. The fibula was gradually brought out to length as determined by posterior fracture spike reduction. The fibula fracture was debrided and the edges defined to achieve cortical read posteriorly. Reduction was provisionally maintained with Kirschner wires. In this manner, the fibula length was restored and fracture reduced. A lag screw was not placed given the orientation of fracture lines and comminution. Due to poor bone quality and extensive comminution at the fracture site, it was decided to use a locking distal fibula plate. We then selected a Zimmer locking plate to match the anatomy of the distal fibula and placed it laterally. This was implanted under intraoperative fluoroscopy with a combination of distal locking screws and proximal cortical & locking screws.  A manual external rotation stress radiograph was obtained and demonstrated widening of the ankle mortise. Given this intraoperative finding as well as preoperative subluxation, it was decided to reduce and fix the syndesmosis. Therefore a suture fixation system (ZipTight device) was implanted through the fibula plate in cannulated fashion to fix the syndesmosis. Anchor/button position was verified along  anteromedial  tibial cortex by fluoroscopy. A repeat stress radiograph showed improved stability of the ankle mortise to testing but there remained medial valgus laxity suggestive of deltoid ligament injury.   Therefore, we proceeded with making an anteromedial incision over the ankle. We carefully protected the saphenous vein and its branches throughout the procedure. We carried dissection down to the medial malleolus and ankle joint capsule which was found to be torn. We then found torn deltoid ligament with interposition within the medial ankle mortise. We freed this interposed ligament and thoroughly irrigated the ankle joint removing loose bodies. We then observed under fluoroscopy that the valgus laxity was much improved. We used a 1.4 mm wire to prepare the medial malleolus for an all-suture anchor Pharmacist, hospital). The suture strands from this anchor were passed distally through the deltoid ligament and then tied. We then brought these up into the area of the medial malleolus and tied these down.   A repeat stress radiograph showed complete stability and congruency of the ankle mortise to testing.  The surgical sites were thoroughly irrigated. Two cc of demineralized bone matrix putty was applied in the bone void of the distal fibula where extensive traumatic bone loss was noted earlier. The tourniquet was deflated and hemostasis achieved. Excellent bounding pulses of the DP and PT arteries noted with immediate capillary refill < 2 seconds. The deep layers were closed using 2-0 vicryl and the subcutaneous tissues closed using 3-0 vicryl. The skin was closed without tension using 3-0 nylon suture.    The leg was cleaned with saline and sterile xeroform dressings with gauze were applied. A well padded bulky short leg splint was applied. The patient was awakened from anesthesia and transported to the recovery room in stable condition.    FOLLOW UP PLAN: -transfer to PACU, then home -strict NWB  operative extremity, maximum elevation -maintain short leg splint until follow up -DVT ppx: Aspirin 81 mg twice daily while NWB -follow up as outpatient in 7-10 days for wound check -sutures out in 2-3 weeks with exchange of short leg splint to short leg cast in outpatient office -smoking cessation counseling   RADIOGRAPHS: AP, lateral, oblique and stress radiographs of the right ankle were obtained intraoperatively. These showed interval reduction and fixation of the fractures. Manual stress radiographs were taken and the ankle mortise and tibiofibular relationship were noted to be stable following fixation. All hardware is appropriately positioned and of the appropriate lengths. No other acute injuries are noted.   Armond Hang Orthopaedic Surgery [facility]

## 2021-06-19 NOTE — Anesthesia Procedure Notes (Addendum)
Anesthesia Regional Block: Popliteal block   Pre-Anesthetic Checklist: , timeout performed,  Correct Patient, Correct Site, Correct Laterality,  Correct Procedure, Correct Position, site marked,  Risks and benefits discussed,  Pre-op evaluation,  At surgeon's request and post-op pain management  Laterality: Right  Prep: Maximum Sterile Barrier Precautions used, chloraprep       Needles:  Injection technique: Single-shot  Needle Type: Echogenic Stimulator Needle     Needle Length: 9cm  Needle Gauge: 21     Additional Needles:   Procedures:,,,, ultrasound used (permanent image in chart),,    Narrative:  Start time: 06/19/2021 1:37 PM End time: 06/19/2021 1:47 PM Injection made incrementally with aspirations every 5 mL.  Performed by: Personally  Anesthesiologist: Roderic Palau, MD  Additional Notes:  Adductor canal block with 15cc of 0.5% Bupivicaine with 1:200k epi.

## 2021-06-19 NOTE — H&P (Signed)
H&P Update: ? ?-History and Physical Reviewed ? ?-Patient has been re-examined ? ?-No change in the plan of care ? ?-The risks and benefits were presented and reviewed. The risks due to hardware failure/irritation, new/persistent infection, stiffness, nerve/vessel/tendon injury, nonunion/malunion, wound healing issues, development of arthritis, failure of this surgery, possibility of external fixation with delayed definitive surgery, need for further surgery, thromboembolic events, anesthesia/medical complications, amputation, death among others were discussed. The patient acknowledged the explanation, agreed to proceed with the plan and a consent was signed. ? ?Shane Arroyo ? ?

## 2021-06-20 ENCOUNTER — Encounter (HOSPITAL_COMMUNITY): Payer: Self-pay | Admitting: Orthopaedic Surgery

## 2021-06-21 NOTE — Anesthesia Postprocedure Evaluation (Signed)
Anesthesia Post Note  Patient: Shane Arroyo  Procedure(s) Performed: OPEN REDUCTION INTERNAL FIXATION (ORIF) ANKLE FRACTURE (Right: Ankle)     Patient location during evaluation: PACU Anesthesia Type: General Level of consciousness: awake and alert Pain management: pain level controlled Vital Signs Assessment: post-procedure vital signs reviewed and stable Respiratory status: spontaneous breathing, nonlabored ventilation, respiratory function stable and patient connected to nasal cannula oxygen Cardiovascular status: blood pressure returned to baseline and stable Postop Assessment: no apparent nausea or vomiting Anesthetic complications: no   No notable events documented.  Last Vitals:  Vitals:   06/19/21 1915 06/19/21 1930  BP: (!) 123/94 121/88  Pulse: 94 93  Resp: (!) 22 10  Temp:  36.6 C  SpO2: 97% 100%    Last Pain:  Vitals:   06/19/21 1930  PainSc: 0-No pain                 Cielo Arias S

## 2021-06-22 NOTE — Addendum Note (Signed)
Addendum  created 06/22/21 0643 by Roderic Palau, MD   Clinical Note Signed, Intraprocedure Blocks edited

## 2021-06-29 DIAGNOSIS — M25571 Pain in right ankle and joints of right foot: Secondary | ICD-10-CM | POA: Insufficient documentation

## 2021-07-18 DIAGNOSIS — S93409A Sprain of unspecified ligament of unspecified ankle, initial encounter: Secondary | ICD-10-CM | POA: Insufficient documentation

## 2021-09-12 ENCOUNTER — Encounter: Payer: Self-pay | Admitting: Emergency Medicine

## 2021-09-12 ENCOUNTER — Ambulatory Visit
Admission: EM | Admit: 2021-09-12 | Discharge: 2021-09-12 | Disposition: A | Discharge status: Home / Self Care | Payer: Commercial Managed Care - HMO | Attending: Internal Medicine | Admitting: Internal Medicine

## 2021-09-12 DIAGNOSIS — Z113 Encounter for screening for infections with a predominantly sexual mode of transmission: Secondary | ICD-10-CM | POA: Diagnosis not present

## 2021-09-12 NOTE — ED Provider Notes (Signed)
[facility]    CSN: [id number] Arrival date & time: 09/12/21  1604      History   Chief Complaint Chief Complaint  Patient presents with   SEXUALLY TRANSMITTED DISEASE    HPI Shane Arroyo is a 46 y.o. male.   Patient presents for routine STD testing.  Denies any confirmed exposure or any associated symptoms.  Patient would like blood work for HIV and syphilis as well.     Past Medical History:  Diagnosis Date   Diverticulitis    GERD (gastroesophageal reflux disease)     Patient Active Problem List   Diagnosis Date Noted   Diverticulitis of colon 07/01/2019    Past Surgical History:  Procedure Laterality Date   LUMBAR LAMINECTOMY/DECOMPRESSION MICRODISCECTOMY Right 11/03/2015   Procedure: RIGHT SIDED LUMBAR 5-SACRUM 1 MICRODISCECTOMY;  Surgeon: Phylliss Bob, MD;  Location: [place];  Service: Orthopedics;  Laterality: Right;  RIGHT SIDED LUMBAR 5-SACRUM 1 MICRODISCECTOMY   ORIF ANKLE FRACTURE Right 06/19/2021   Procedure: OPEN REDUCTION INTERNAL FIXATION (ORIF) ANKLE FRACTURE;  Surgeon: Armond Hang, MD;  Location: [place];  Service: Orthopedics;  Laterality: Right;   WISDOM TOOTH EXTRACTION     2 taken out       Home Medications    Prior to Admission medications   Medication Sig Start Date End Date Taking? Authorizing Provider  diclofenac Sodium (VOLTAREN) 1 % GEL Apply 2 g topically 4 (four) times daily. Patient not taking: Reported on 06/16/2021 03/03/20   Delia Heady, PA-C  famotidine (PEPCID) 20 MG tablet Take 1 tablet (20 mg total) by mouth 2 (two) times daily. Patient not taking: Reported on 06/16/2021 02/26/20   Nuala Alpha A, PA-C  ibuprofen (ADVIL) 800 MG tablet Take 800 mg by mouth every 6 (six) hours as needed for mild pain.    [provider]  methocarbamol (ROBAXIN) 500 MG tablet Take 1 tablet (500 mg total) by mouth 2 (two) times daily. Patient not taking: Reported on 06/16/2021 03/03/20   Delia Heady, PA-C   metroNIDAZOLE (FLAGYL) 500 MG tablet Take 1 tablet (500 mg total) by mouth 2 (two) times daily. Patient taking differently: Take 500 mg by mouth daily as needed (diverticulitis). 09/07/20   Ladene Artist, MD  oxyCODONE-acetaminophen (PERCOCET) 5-325 MG tablet Take 1 tablet by mouth every 4 (four) hours as needed. 06/12/21   Larene Pickett, PA-C  pantoprazole (PROTONIX) 20 MG tablet Take 1 tablet (20 mg total) by mouth daily. Patient taking differently: Take 20 mg by mouth daily as needed (diverticulitis). 02/26/20   Deliah Boston, PA-C    Family History Family History  Problem Relation Age of Onset   Diabetes Father    Stomach cancer Maternal Grandmother    Alcohol abuse Maternal Grandfather    Diabetes Paternal Grandmother    Diabetes Paternal Grandfather    Colon cancer Neg Hx    Colon polyps Neg Hx    Esophageal cancer Neg Hx    Rectal cancer Neg Hx     Social History Social History   Tobacco Use   Smoking status: Every Day    Packs/day: 0.50    Types: Cigarettes   Smokeless tobacco: Never  Vaping Use   Vaping Use: Never used  Substance Use Topics   Alcohol use: Yes    Comment: mainly on weekends   Drug use: Yes    Types: Marijuana, Cocaine    Comment: last cocaine used 2 weeks ago, marijuana 4 days ago  Allergies   Patient has no known allergies.   Review of Systems Review of Systems Per HPI  Physical Exam Triage Vital Signs ED Triage Vitals [09/12/21 1615]  Enc Vitals Group     BP (!) 143/95     Pulse Rate 86     Resp 18     Temp 98.2 F (36.8 C)     Temp src      SpO2 97 %     Weight      Height      Head Circumference      Peak Flow      Pain Score 0     Pain Loc      Pain Edu?      Excl. in [place]?    No data found.  Updated Vital Signs BP (!) 143/95   Pulse 86   Temp 98.2 F (36.8 C)   Resp 18   SpO2 97%   Visual Acuity Right Eye Distance:   Left Eye Distance:   Bilateral Distance:    Right Eye Near:   Left Eye Near:     Bilateral Near:     Physical Exam Constitutional:      General: He is not in acute distress.    Appearance: Normal appearance. He is not toxic-appearing or diaphoretic.  HENT:     Head: Normocephalic and atraumatic.  Eyes:     Extraocular Movements: Extraocular movements intact.     Conjunctiva/sclera: Conjunctivae normal.  Pulmonary:     Effort: Pulmonary effort is normal.  Genitourinary:    Comments: Deferred with shared decision-making.  Self swab performed. Neurological:     General: No focal deficit present.     Mental Status: He is alert and oriented to person, place, and time. Mental status is at baseline.  Psychiatric:        Mood and Affect: Mood normal.        Behavior: Behavior normal.        Thought Content: Thought content normal.        Judgment: Judgment normal.      UC Treatments / Results  Labs (all labs ordered are listed, but only abnormal results are displayed) Labs Reviewed  RPR  HIV ANTIBODY (ROUTINE TESTING W REFLEX)  CYTOLOGY, (ORAL, ANAL, URETHRAL) ANCILLARY ONLY    EKG   Radiology No results found.  Procedures Procedures (including critical care time)  Medications Ordered in UC Medications - No data to display  Initial Impression / Assessment and Plan / UC Course  I have reviewed the triage vital signs and the nursing notes.  Pertinent labs & imaging results that were available during my care of the patient were reviewed by me and considered in my medical decision making (see chart for details).     Routine cytology swab, HIV, RPR test pending.  No confirmed exposure so will await results for any treatment.  Patient to refrain from sexual activity until test results and treatment are complete.  Discussed return precautions.  Patient verbalized understanding and was agreeable with plan. Final Clinical Impressions(s) / UC Diagnoses   Final diagnoses:  Screening examination for venereal disease     Discharge Instructions       STD tests are pending.  We will call if they are positive and treat as appropriate.  We ask that you refrain from sexual activity until results and treatment are complete.    ED Prescriptions   None    PDMP not reviewed this encounter.  Teodora Medici, [place] 09/12/21 [phone]

## 2021-09-12 NOTE — ED Triage Notes (Signed)
Pt is present today with concerns for STD. Pt denies any STD

## 2021-09-12 NOTE — Discharge Instructions (Signed)
STD tests are pending.  We will call if they are positive and treat as appropriate.  We ask that you refrain from sexual activity until results and treatment are complete.

## 2021-09-13 LAB — CYTOLOGY, (ORAL, ANAL, URETHRAL) ANCILLARY ONLY
Chlamydia: NEGATIVE
Comment: NEGATIVE
Comment: NEGATIVE
Comment: NORMAL
Neisseria Gonorrhea: NEGATIVE
Trichomonas: NEGATIVE

## 2021-09-13 LAB — RPR: RPR Ser Ql: NONREACTIVE

## 2021-09-13 LAB — HIV ANTIBODY (ROUTINE TESTING W REFLEX): HIV Screen 4th Generation wRfx: NONREACTIVE

## 2022-02-07 ENCOUNTER — Ambulatory Visit
Admission: EM | Admit: 2022-02-07 | Discharge: 2022-02-07 | Disposition: A | Discharge status: Home / Self Care | Payer: Commercial Managed Care - HMO | Attending: Physician Assistant | Admitting: Physician Assistant

## 2022-02-07 DIAGNOSIS — R5383 Other fatigue: Secondary | ICD-10-CM

## 2022-02-07 DIAGNOSIS — B353 Tinea pedis: Secondary | ICD-10-CM

## 2022-02-07 DIAGNOSIS — R3589 Other polyuria: Secondary | ICD-10-CM

## 2022-02-07 DIAGNOSIS — L03116 Cellulitis of left lower limb: Secondary | ICD-10-CM

## 2022-02-07 LAB — POCT URINALYSIS DIP (MANUAL ENTRY)
Bilirubin, UA: NEGATIVE
Glucose, UA: NEGATIVE mg/dL
Ketones, POC UA: NEGATIVE mg/dL
Leukocytes, UA: NEGATIVE
Nitrite, UA: NEGATIVE
Protein Ur, POC: 30 mg/dL — AB
Spec Grav, UA: >=1.03 — AB (ref 1.010–1.025)
Urobilinogen, UA: 2 E.U./dL — AB
pH, UA: 7 (ref 5.0–8.0)

## 2022-02-07 MED ORDER — ECONAZOLE NITRATE 1 % EX CREA: TOPICAL_CREAM | Freq: Two times a day (BID) | CUTANEOUS | 1 refills | Status: AC

## 2022-02-07 MED ORDER — DOXYCYCLINE HYCLATE 100 MG PO CAPS: 100.0000 mg | ORAL_CAPSULE | Freq: Two times a day (BID) | ORAL | 0 refills | Status: DC

## 2022-02-07 NOTE — Discharge Instructions (Signed)
Advised to take the doxycycline 100 mg every 12 hours until completed as this will treat the infection and hopefully quicken healing time. Advised to use the Spectazole cream and apply it in between the toes twice daily to help the fungus resolved.  Lab test will be completed in 48 hours, if you do not get a call from this office that indicates the test are negative.  Log onto MyChart to be the test results with the post in 48 hours.

## 2022-02-07 NOTE — ED Triage Notes (Signed)
Pt c/o 3 skin lesions to left leg, to left arm, and groin area onset ~ 2 weeks ago concerned for spider bite. Feels tired, blurry vision. Also c/o athletes foot.

## 2022-02-07 NOTE — ED Provider Notes (Signed)
[facility]    CSN: [id number] Arrival date & time: 02/07/22  1438      History   Chief Complaint Chief Complaint  Patient presents with   skin lesions    HPI Shane Arroyo is a 47 y.o. male.   47 year old male presents with sores on the left lower leg, left wrist, and penile shaft.  Patient indicates for the past 2 weeks he has been having intermittent sores develop on the left lower leg that have been red, raised, intermittently draining and then crusting over.  He also indicates he has a similar area on the left wrist, and penile shaft.  He indicates once the lesions crusted over they start to heal.  Patient is concerned about these being spider bites as he has been moving from house to house and close has been in a storage building.  Patient indicates he is also had mild fever, fatigue over the past 2 weeks, intermittent blurred vision.  He relates he has been going to the bathroom at night several times without dysuria.  He is without nausea or vomiting, and is tolerating fluids well.  He also has areas on the feet that he is concerned about fungal infection.     Past Medical History:  Diagnosis Date   Diverticulitis    GERD (gastroesophageal reflux disease)     Patient Active Problem List   Diagnosis Date Noted   Diverticulitis of colon 07/01/2019    Past Surgical History:  Procedure Laterality Date   LUMBAR LAMINECTOMY/DECOMPRESSION MICRODISCECTOMY Right 11/03/2015   Procedure: RIGHT SIDED LUMBAR 5-SACRUM 1 MICRODISCECTOMY;  Surgeon: Phylliss Bob, MD;  Location: [place];  Service: Orthopedics;  Laterality: Right;  RIGHT SIDED LUMBAR 5-SACRUM 1 MICRODISCECTOMY   ORIF ANKLE FRACTURE Right 06/19/2021   Procedure: OPEN REDUCTION INTERNAL FIXATION (ORIF) ANKLE FRACTURE;  Surgeon: Armond Hang, MD;  Location: [place];  Service: Orthopedics;  Laterality: Right;   WISDOM TOOTH EXTRACTION     2 taken out       Home Medications    Prior to Admission  medications   Medication Sig Start Date End Date Taking? Authorizing Provider  doxycycline (VIBRAMYCIN) 100 MG capsule Take 1 capsule (100 mg total) by mouth 2 (two) times daily. 02/07/22  Yes Nyoka Lint, PA-C  econazole nitrate 1 % cream Apply topically 2 (two) times daily. 02/07/22  Yes Nyoka Lint, PA-C  diclofenac Sodium (VOLTAREN) 1 % GEL Apply 2 g topically 4 (four) times daily. Patient not taking: Reported on 06/16/2021 03/03/20   Delia Heady, PA-C  famotidine (PEPCID) 20 MG tablet Take 1 tablet (20 mg total) by mouth 2 (two) times daily. Patient not taking: Reported on 06/16/2021 02/26/20   Nuala Alpha A, PA-C  ibuprofen (ADVIL) 800 MG tablet Take 800 mg by mouth every 6 (six) hours as needed for mild pain.    [provider]  methocarbamol (ROBAXIN) 500 MG tablet Take 1 tablet (500 mg total) by mouth 2 (two) times daily. Patient not taking: Reported on 06/16/2021 03/03/20   Delia Heady, PA-C  metroNIDAZOLE (FLAGYL) 500 MG tablet Take 1 tablet (500 mg total) by mouth 2 (two) times daily. Patient taking differently: Take 500 mg by mouth daily as needed (diverticulitis). 09/07/20   Ladene Artist, MD  oxyCODONE-acetaminophen (PERCOCET) 5-325 MG tablet Take 1 tablet by mouth every 4 (four) hours as needed. 06/12/21   Larene Pickett, PA-C  pantoprazole (PROTONIX) 20 MG tablet Take 1 tablet (20 mg total) by mouth daily.  Patient taking differently: Take 20 mg by mouth daily as needed (diverticulitis). 02/26/20   Deliah Boston, PA-C    Family History Family History  Problem Relation Age of Onset   Diabetes Father    Stomach cancer Maternal Grandmother    Alcohol abuse Maternal Grandfather    Diabetes Paternal Grandmother    Diabetes Paternal Grandfather    Colon cancer Neg Hx    Colon polyps Neg Hx    Esophageal cancer Neg Hx    Rectal cancer Neg Hx     Social History Social History   Tobacco Use   Smoking status: Every Day    Packs/day: 0.50    Types: Cigarettes    Smokeless tobacco: Never  Vaping Use   Vaping Use: Never used  Substance Use Topics   Alcohol use: Yes    Comment: mainly on weekends   Drug use: Yes    Types: Marijuana, Cocaine    Comment: last cocaine used 2 weeks ago, marijuana 4 days ago     Allergies   Patient has no known allergies.   Review of Systems Review of Systems  Skin:  Positive for rash (cystic formations of the leg).     Physical Exam Triage Vital Signs ED Triage Vitals [02/07/22 1520]  Enc Vitals Group     BP (!) 142/95     Pulse Rate 96     Resp 16     Temp 99.1 F (37.3 C)     Temp Source Oral     SpO2 97 %     Weight      Height      Head Circumference      Peak Flow      Pain Score 10     Pain Loc      Pain Edu?      Excl. in [place]?    No data found.  Updated Vital Signs BP (!) 142/95 (BP Location: Left Arm)   Pulse 96   Temp 99.1 F (37.3 C) (Oral)   Resp 16   SpO2 97%   Visual Acuity Right Eye Distance:   Left Eye Distance:   Bilateral Distance:    Right Eye Near:   Left Eye Near:    Bilateral Near:     Physical Exam Constitutional:      Appearance: Normal appearance.  Cardiovascular:     Rate and Rhythm: Normal rate and regular rhythm.     Heart sounds: Normal heart sounds.  Pulmonary:     Effort: Pulmonary effort is normal.     Breath sounds: Normal breath sounds and air entry. No wheezing, rhonchi or rales.  Skin:         Comments: 1 there are 4 separate lesions present on the body.  2 lesions on the left lower leg at the tibia area and 1 lesion on the left posterior wrist.  These areas are 1 x 2 cm, round, red and slightly raised and are draining purulent material.  There is no unusual streaking.   The left wrist lesion is 0.5 x 0.5 cm and is a pustule that has not started draining.  There is no streaking to this area. The penis has a point 1 to 5 cm ulcerated area on the underside of the penis without drainage or streaking.  There is no unusual swelling.    Neurological:     Mental Status: He is alert.      UC Treatments / Results  Labs (all  labs ordered are listed, but only abnormal results are displayed) Labs Reviewed  POCT URINALYSIS DIP (MANUAL ENTRY) - Abnormal; Notable for the following components:      Result Value   Spec Grav, UA >=1.030 (*)    Blood, UA trace-intact (*)    Protein Ur, POC =30 (*)    Urobilinogen, UA 2.0 (*)    All other components within normal limits  CBC  COMPREHENSIVE METABOLIC PANEL  RPR    EKG   Radiology No results found.  Procedures Procedures (including critical care time)  Medications Ordered in UC Medications - No data to display  Initial Impression / Assessment and Plan / UC Course  I have reviewed the triage vital signs and the nursing notes.  Pertinent labs & imaging results that were available during my care of the patient were reviewed by me and considered in my medical decision making (see chart for details).    Plan: The diagnosis will be treated with the following: 1.  Cellulitis of left leg: A.  Doxycycline 100 mg every 12 hours to treat infection. B.  RPR lab test is pending. 2.  Fatigue: A.  CBC and CMP lab results are pending. 3.  Tinea pedis: A.  Spectazole cream twice daily to treat the fungus. 4.  Patient advised follow-up PCP or return to urgent care if symptoms fail to improve.  Final Clinical Impressions(s) / UC Diagnoses   Final diagnoses:  Tinea pedis of both feet  Cellulitis of leg, left  Polyuria  Other fatigue     Discharge Instructions      Advised to take the doxycycline 100 mg every 12 hours until completed as this will treat the infection and hopefully quicken healing time. Advised to use the Spectazole cream and apply it in between the toes twice daily to help the fungus resolved.  Lab test will be completed in 48 hours, if you do not get a call from this office that indicates the test are negative.  Log onto MyChart to be the test  results with the post in 48 hours.     ED Prescriptions     Medication Sig Dispense Auth. Provider   doxycycline (VIBRAMYCIN) 100 MG capsule Take 1 capsule (100 mg total) by mouth 2 (two) times daily. 20 capsule Nyoka Lint, PA-C   econazole nitrate 1 % cream Apply topically 2 (two) times daily. 15 g Nyoka Lint, PA-C      PDMP not reviewed this encounter.   Nyoka Lint, PA-C 02/07/22 [phone]

## 2022-02-08 LAB — COMPREHENSIVE METABOLIC PANEL
ALT: 16 IU/L (ref 0–44)
AST: 17 IU/L (ref 0–40)
Albumin/Globulin Ratio: 1.3 (ref 1.2–2.2)
Albumin: 4.4 g/dL (ref 4.1–5.1)
Alkaline Phosphatase: 91 IU/L (ref 44–121)
BUN/Creatinine Ratio: 14 (ref 9–20)
BUN: 21 mg/dL (ref 6–24)
Bilirubin Total: 0.2 mg/dL (ref 0.0–1.2)
CO2: 23 mmol/L (ref 20–29)
Calcium: 9.3 mg/dL (ref 8.7–10.2)
Chloride: 101 mmol/L (ref 96–106)
Creatinine, Ser: 1.46 mg/dL — ABNORMAL HIGH (ref 0.76–1.27)
Globulin, Total: 3.5 g/dL (ref 1.5–4.5)
Glucose: 79 mg/dL (ref 70–99)
Potassium: 4.4 mmol/L (ref 3.5–5.2)
Sodium: 139 mmol/L (ref 134–144)
Total Protein: 7.9 g/dL (ref 6.0–8.5)
eGFR: 60 mL/min/{1.73_m2} (ref >59)

## 2022-02-08 LAB — CBC
Hematocrit: 46.3 % (ref 37.5–51.0)
Hemoglobin: 15.3 g/dL (ref 13.0–17.7)
MCH: 29.9 pg (ref 26.6–33.0)
MCHC: 33 g/dL (ref 31.5–35.7)
MCV: 91 fL (ref 79–97)
Platelets: 230 10*3/uL (ref 150–450)
RBC: 5.11 x10E6/uL (ref 4.14–5.80)
RDW: 12.6 % (ref 11.6–15.4)
WBC: 10.1 10*3/uL (ref 3.4–10.8)

## 2022-02-08 LAB — RPR: RPR Ser Ql: NONREACTIVE

## 2022-04-10 ENCOUNTER — Encounter: Payer: Self-pay | Admitting: Emergency Medicine

## 2022-04-10 ENCOUNTER — Ambulatory Visit
Admission: EM | Admit: 2022-04-10 | Discharge: 2022-04-10 | Disposition: A | Discharge status: Home / Self Care | Payer: Commercial Managed Care - HMO | Attending: Physician Assistant | Admitting: Physician Assistant

## 2022-04-10 DIAGNOSIS — R509 Fever, unspecified: Secondary | ICD-10-CM

## 2022-04-10 DIAGNOSIS — B353 Tinea pedis: Secondary | ICD-10-CM

## 2022-04-10 DIAGNOSIS — J020 Streptococcal pharyngitis: Secondary | ICD-10-CM

## 2022-04-10 DIAGNOSIS — J069 Acute upper respiratory infection, unspecified: Secondary | ICD-10-CM

## 2022-04-10 LAB — POCT RAPID STREP A (OFFICE): Rapid Strep A Screen: POSITIVE — AB

## 2022-04-10 MED ORDER — AMOXICILLIN 250 MG/5ML PO SUSR
500.0000 mg | Freq: Three times a day (TID) | ORAL | 0 refills | Status: DC
Start: 2022-04-10 — End: 2022-08-28

## 2022-04-10 MED ORDER — LIDOCAINE VISCOUS HCL 2 % MT SOLN: 10.0000 mL | Freq: Four times a day (QID) | OROMUCOSAL | 0 refills | Status: AC

## 2022-04-10 MED ORDER — FLUCONAZOLE 150 MG PO TABS
150.0000 mg | ORAL_TABLET | ORAL | 0 refills | Status: DC
Start: 2022-04-11 — End: 2022-07-02

## 2022-04-10 NOTE — Discharge Instructions (Signed)
Advised take the amoxicillin 2 teaspoons 3 times a day over the next several days to treat the strep. Advised take the Magic mouthwash, 2 teaspoons gargle 60 seconds and then swallow, 3-4 times a day to help relieve the pain from the sore throat.  Advised take the Diflucan 150 mg tablet 3 times week for 3 weeks as this will treat the athletes feet.  Advised follow-up PCP return to urgent care as needed.

## 2022-04-10 NOTE — ED Triage Notes (Signed)
Pt is present today with c/o sore throat and ear discomfort x2 days ago

## 2022-04-10 NOTE — ED Provider Notes (Signed)
[facility]    CSN: [id number] Arrival date & time: 04/10/22  1200      History   Chief Complaint Chief Complaint  Patient presents with   Sore Throat    HPI Shane Arroyo is a 47 y.o. male.   47 year old male presents with progressive sore throat and fever.  Patient indicates for the past 3 days she has been having increasing sore throat, painful swallowing, patient indicates he is having problems swallowing liquids and foods due to the pain of the sore throat.  He indicates he had fever 100 201.  Patient also indicates he has had fatigue, body aches and pains, no relief with OTC medications.  He indicates he is having minimal cough and congestion. 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  Patient indicates that he has taken Diflucan tablet before and has worked.  Patient requested prescription for Diflucan tablets to treat the tinea pedis.   Sore Throat    Past Medical History:  Diagnosis Date   Diverticulitis    GERD (gastroesophageal reflux disease)     Patient Active Problem List   Diagnosis Date Noted   Diverticulitis of colon 07/01/2019    Past Surgical History:  Procedure Laterality Date   LUMBAR LAMINECTOMY/DECOMPRESSION MICRODISCECTOMY Right 11/03/2015   Procedure: RIGHT SIDED LUMBAR 5-SACRUM 1 MICRODISCECTOMY;  Surgeon: Phylliss Bob, MD;  Location: [place];  Service: Orthopedics;  Laterality: Right;  RIGHT SIDED LUMBAR 5-SACRUM 1 MICRODISCECTOMY   ORIF ANKLE FRACTURE Right 06/19/2021   Procedure: OPEN REDUCTION INTERNAL FIXATION (ORIF) ANKLE FRACTURE;  Surgeon: Armond Hang, MD;  Location: [place];  Service: Orthopedics;  Laterality: Right;   WISDOM TOOTH EXTRACTION     2 taken out       Home Medications    Prior to Admission medications   Medication Sig Start Date End Date Taking? Authorizing Provider  amoxicillin (AMOXIL) 250 MG/5ML suspension  Take 10 mLs (500 mg total) by mouth 3 (three) times daily. 04/10/22  Yes Nyoka Lint, PA-C  fluconazole (DIFLUCAN) 150 MG tablet Take 1 tablet (150 mg total) by mouth 3 (three) times a week. 04/11/22  Yes Nyoka Lint, PA-C  magic mouthwash (lidocaine, diphenhydrAMINE, alum & mag hydroxide) suspension Swish and swallow 10 mLs 4 (four) times daily. 04/10/22  Yes Nyoka Lint, PA-C  diclofenac Sodium (VOLTAREN) 1 % GEL Apply 2 g topically 4 (four) times daily. Patient not taking: Reported on 06/16/2021 03/03/20   Delia Heady, PA-C  doxycycline (VIBRAMYCIN) 100 MG capsule Take 1 capsule (100 mg total) by mouth 2 (two) times daily. 02/07/22   Nyoka Lint, PA-C  econazole nitrate 1 % cream Apply topically 2 (two) times daily. 02/07/22   Nyoka Lint, PA-C  famotidine (PEPCID) 20 MG tablet Take 1 tablet (20 mg total) by mouth 2 (two) times daily. Patient not taking: Reported on 06/16/2021 02/26/20   Nuala Alpha A, PA-C  ibuprofen (ADVIL) 800 MG tablet Take 800 mg by mouth every 6 (six) hours as needed for mild pain.    [provider]  methocarbamol (ROBAXIN) 500 MG tablet Take 1 tablet (500 mg total) by mouth 2 (two) times daily. Patient not taking: Reported on 06/16/2021 03/03/20   Delia Heady, PA-C  metroNIDAZOLE (FLAGYL) 500 MG tablet Take 1 tablet (500 mg total) by mouth 2 (two) times daily. Patient taking differently: Take 500 mg by mouth daily as needed (diverticulitis). 09/07/20   Ladene Artist, MD  oxyCODONE-acetaminophen (PERCOCET) 5-325 MG tablet Take 1 tablet by mouth every 4 (four) hours as needed. 06/12/21   Larene Pickett, PA-C  pantoprazole (PROTONIX) 20 MG tablet Take 1 tablet (20 mg total) by mouth daily. Patient taking differently: Take 20 mg by mouth daily as needed (diverticulitis). 02/26/20   Deliah Boston, PA-C    Family History Family History  Problem Relation Age of Onset   Diabetes Father    Stomach cancer Maternal Grandmother    Alcohol abuse Maternal Grandfather     Diabetes Paternal Grandmother    Diabetes Paternal Grandfather    Colon cancer Neg Hx    Colon polyps Neg Hx    Esophageal cancer Neg Hx    Rectal cancer Neg Hx     Social History Social History   Tobacco Use   Smoking status: Every Day    Packs/day: .5    Types: Cigarettes   Smokeless tobacco: Never  Vaping Use   Vaping Use: Never used  Substance Use Topics   Alcohol use: Yes    Comment: mainly on weekends   Drug use: Yes    Types: Marijuana, Cocaine    Comment: last cocaine used 2 weeks ago, marijuana 4 days ago     Allergies   Patient has no known allergies.   Review of Systems Review of Systems  Constitutional:  Positive for fatigue and fever.  HENT:  Positive for sore throat.      Physical Exam Triage Vital Signs ED Triage Vitals  Enc Vitals Group     BP 04/10/22 1348 (!) 139/92     Pulse Rate 04/10/22 1348 95     Resp 04/10/22 1348 18     Temp 04/10/22 1348 99.3 F (37.4 C)     Temp src --      SpO2 04/10/22 1348 96 %     Weight --      Height --      Head Circumference --      Peak Flow --      Pain Score 04/10/22 1346 10     Pain Loc --      Pain Edu? --      Excl. in [place]? --    No data found.  Updated Vital Signs BP (!) 139/92   Pulse 95   Temp 99.3 F (37.4 C)   Resp 18   SpO2 96%   Visual Acuity Right Eye Distance:   Left Eye Distance:   Bilateral Distance:    Right Eye Near:   Left Eye Near:    Bilateral Near:     Physical Exam Constitutional:      Appearance: He is well-developed.  HENT:     Right Ear: Tympanic membrane and ear canal normal.     Left Ear: Tympanic membrane and ear canal normal.     Mouth/Throat:     Mouth: Mucous membranes are moist.     Pharynx: Posterior oropharyngeal erythema present. No oropharyngeal exudate.  Cardiovascular:     Rate and Rhythm: Normal rate and regular rhythm.     Heart sounds: Normal heart sounds.  Pulmonary:     Effort: Pulmonary effort is normal.     Breath sounds:  Normal breath sounds and air entry. No wheezing, rhonchi or rales.  Lymphadenopathy:     Cervical: No cervical adenopathy.  Neurological:     Mental Status: He is alert.      UC Treatments / Results  Labs (all labs ordered  are listed, but only abnormal results are displayed) Labs Reviewed  POCT RAPID STREP A (OFFICE) - Abnormal; Notable for the following components:      Result Value   Rapid Strep A Screen Positive (*)    All other components within normal limits    EKG   Radiology No results found.  Procedures Procedures (including critical care time)  Medications Ordered in UC Medications - No data to display  Initial Impression / Assessment and Plan / UC Course  I have reviewed the triage vital signs and the nursing notes.  Pertinent labs & imaging results that were available during my care of the patient were reviewed by me and considered in my medical decision making (see chart for details).    Plan: The diagnosis be treated with the following: 1.  Streptococcal sore throat: A.  Amoxicillin 250 mg per 5 cc, 2 teaspoons 3 times a day to treat the strep throat. B.  Magic mouthwash, gargle 60 seconds then swallow, 3-4 times a day to help improve and decrease sore throat. 2.  Fever: A.  Advised take Tylenol or ibuprofen as needed for fever and to decrease body aches. 3.  Upper respiratory infection: A.  Advised take OTC medication to help decrease cough and congestion. 4.  Tinea pedis: A.  Diflucan 150 mg, 1 tablet 3 times a week for 3 weeks only. 5.  Advised follow-up PCP return to urgent care as needed. Final Clinical Impressions(s) / UC Diagnoses   Final diagnoses:  Fever, unspecified  Streptococcal sore throat  Acute upper respiratory infection  Tinea pedis of both feet     Discharge Instructions      Advised take the amoxicillin 2 teaspoons 3 times a day over the next several days to treat the strep. Advised take the Magic mouthwash, 2 teaspoons  gargle 60 seconds and then swallow, 3-4 times a day to help relieve the pain from the sore throat.  Advised take the Diflucan 150 mg tablet 3 times week for 3 weeks as this will treat the athletes feet.  Advised follow-up PCP return to urgent care as needed.    ED Prescriptions     Medication Sig Dispense Auth. Provider   amoxicillin (AMOXIL) 250 MG/5ML suspension Take 10 mLs (500 mg total) by mouth 3 (three) times daily. 300 mL Nyoka Lint, PA-C   magic mouthwash (lidocaine, diphenhydrAMINE, alum & mag hydroxide) suspension Swish and swallow 10 mLs 4 (four) times daily. 150 mL Nyoka Lint, PA-C   fluconazole (DIFLUCAN) 150 MG tablet Take 1 tablet (150 mg total) by mouth 3 (three) times a week. 12 tablet Nyoka Lint, PA-C      PDMP not reviewed this encounter.   Nyoka Lint, PA-C 04/10/22 1423

## 2022-07-02 ENCOUNTER — Ambulatory Visit
Admission: EM | Admit: 2022-07-02 | Discharge: 2022-07-02 | Disposition: A | Discharge status: Home / Self Care | Payer: Commercial Managed Care - HMO | Attending: Emergency Medicine | Admitting: Emergency Medicine

## 2022-07-02 DIAGNOSIS — B353 Tinea pedis: Secondary | ICD-10-CM

## 2022-07-02 MED ORDER — TERBINAFINE HCL 250 MG PO TABS: 250.0000 mg | ORAL_TABLET | Freq: Every day | ORAL | 1 refills | Status: AC

## 2022-07-02 MED ORDER — CLOTRIMAZOLE-BETAMETHASONE 1-0.05 % EX CREA: TOPICAL_CREAM | CUTANEOUS | 0 refills | Status: DC

## 2022-07-02 NOTE — ED Provider Notes (Signed)
[facility]    CSN: [id number] Arrival date & time: 07/02/22  1027      History   Chief Complaint Chief Complaint  Patient presents with   Tinea Pedis    HPI Shane Arroyo is a 47 y.o. male.   Patient presents today with athletes feet.  Patient has been treated for this for several months multiple different treatments.  With minimal results.  He has used over-the-counter medications that have not helped.  Patient states he has not seen a podiatrist.  Denies being a diabetic    Past Medical History:  Diagnosis Date   Diverticulitis    GERD (gastroesophageal reflux disease)     Patient Active Problem List   Diagnosis Date Noted   Diverticulitis of colon 07/01/2019    Past Surgical History:  Procedure Laterality Date   LUMBAR LAMINECTOMY/DECOMPRESSION MICRODISCECTOMY Right 11/03/2015   Procedure: RIGHT SIDED LUMBAR 5-SACRUM 1 MICRODISCECTOMY;  Surgeon: Estill Bamberg, MD;  Location: [facility];  Service: Orthopedics;  Laterality: Right;  RIGHT SIDED LUMBAR 5-SACRUM 1 MICRODISCECTOMY   ORIF ANKLE FRACTURE Right 06/19/2021   Procedure: OPEN REDUCTION INTERNAL FIXATION (ORIF) ANKLE FRACTURE;  Surgeon: Netta Cedars, MD;  Location: [facility];  Service: Orthopedics;  Laterality: Right;   WISDOM TOOTH EXTRACTION     2 taken out       Home Medications    Prior to Admission medications   Medication Sig Start Date End Date Taking? Authorizing Provider  clotrimazole-betamethasone (LOTRISONE) cream Apply to affected area 2 times daily prn 07/02/22  Yes Maple Mirza L, NP  terbinafine (LAMISIL) 250 MG tablet Take 1 tablet (250 mg total) by mouth daily. 07/02/22  Yes Coralyn Mark, NP  amoxicillin (AMOXIL) 250 MG/5ML suspension Take 10 mLs (500 mg total) by mouth 3 (three) times daily. 04/10/22   Ellsworth Lennox, PA-C  diclofenac Sodium (VOLTAREN) 1 % GEL Apply 2 g topically 4 (four) times daily. Patient not taking: Reported on 06/16/2021 03/03/20   Dietrich Pates,  PA-C  doxycycline (VIBRAMYCIN) 100 MG capsule Take 1 capsule (100 mg total) by mouth 2 (two) times daily. 02/07/22   Ellsworth Lennox, PA-C  econazole nitrate 1 % cream Apply topically 2 (two) times daily. 02/07/22   Ellsworth Lennox, PA-C  famotidine (PEPCID) 20 MG tablet Take 1 tablet (20 mg total) by mouth 2 (two) times daily. Patient not taking: Reported on 06/16/2021 02/26/20   Harlene Salts A, PA-C  ibuprofen (ADVIL) 800 MG tablet Take 800 mg by mouth every 6 (six) hours as needed for mild pain.    [provider]  magic mouthwash (lidocaine, diphenhydrAMINE, alum & mag hydroxide) suspension Swish and swallow 10 mLs 4 (four) times daily. 04/10/22   Ellsworth Lennox, PA-C  methocarbamol (ROBAXIN) 500 MG tablet Take 1 tablet (500 mg total) by mouth 2 (two) times daily. Patient not taking: Reported on 06/16/2021 03/03/20   Dietrich Pates, PA-C  metroNIDAZOLE (FLAGYL) 500 MG tablet Take 1 tablet (500 mg total) by mouth 2 (two) times daily. Patient taking differently: Take 500 mg by mouth daily as needed (diverticulitis). 09/07/20   Meryl Dare, MD  oxyCODONE-acetaminophen (PERCOCET) 5-325 MG tablet Take 1 tablet by mouth every 4 (four) hours as needed. 06/12/21   Garlon Hatchet, PA-C  pantoprazole (PROTONIX) 20 MG tablet Take 1 tablet (20 mg total) by mouth daily. Patient taking differently: Take 20 mg by mouth daily as needed (diverticulitis). 02/26/20   Elizabeth Palau    Family History Family  History  Problem Relation Age of Onset   Diabetes Father    Stomach cancer Maternal Grandmother    Alcohol abuse Maternal Grandfather    Diabetes Paternal Grandmother    Diabetes Paternal Grandfather    Colon cancer Neg Hx    Colon polyps Neg Hx    Esophageal cancer Neg Hx    Rectal cancer Neg Hx     Social History Social History   Tobacco Use   Smoking status: Every Day    Packs/day: .5    Types: Cigarettes   Smokeless tobacco: Never  Vaping Use   Vaping Use: Never used  Substance  Use Topics   Alcohol use: Yes    Comment: mainly on weekends   Drug use: Yes    Types: Marijuana, Cocaine    Comment: last cocaine and marijuana 1 week     Allergies   Patient has no known allergies.   Review of Systems Review of Systems  Respiratory: Negative.    Cardiovascular: Negative.   Gastrointestinal: Negative.   Musculoskeletal: Negative.   Skin:  Positive for rash.       Bilateral feet itching with white peeling between the toes     Physical Exam Triage Vital Signs ED Triage Vitals  Enc Vitals Group     BP 07/02/22 1125 (!) 139/97     Pulse Rate 07/02/22 1125 74     Resp 07/02/22 1125 18     Temp 07/02/22 1125 98.4 F (36.9 C)     Temp Source 07/02/22 1125 Oral     SpO2 07/02/22 1125 95 %     Weight --      Height --      Head Circumference --      Peak Flow --      Pain Score 07/02/22 1123 0     Pain Loc --      Pain Edu? --      Excl. in [place]? --    No data found.  Updated Vital Signs BP (!) 139/97 (BP Location: Left Arm)   Pulse 74   Temp 98.4 F (36.9 C) (Oral)   Resp 18   SpO2 95%   Visual Acuity Right Eye Distance:   Left Eye Distance:   Bilateral Distance:    Right Eye Near:   Left Eye Near:    Bilateral Near:     Physical Exam Cardiovascular:     Rate and Rhythm: Normal rate.  Pulmonary:     Effort: Pulmonary effort is normal.  Abdominal:     General: Abdomen is flat.  Skin:    Comments: Peeling with white buildup between bilateral feet toes.  Moderate amount.  No erythema  Neurological:     Mental Status: He is alert.      UC Treatments / Results  Labs (all labs ordered are listed, but only abnormal results are displayed) Labs Reviewed - No data to display  EKG   Radiology No results found.  Procedures Procedures (including critical care time)  Medications Ordered in UC Medications - No data to display  Initial Impression / Assessment and Plan / UC Course  I have reviewed the triage vital signs and the  nursing notes.  Pertinent labs & imaging results that were available during my care of the patient were reviewed by me and considered in my medical decision making (see chart for details).     Patient will need to follow-up with podiatry Discussed with patient he will need to  apply cream twice a day wear cotton socks make sure the area is washed and dried well This may take several months to heal  Final Clinical Impressions(s) / UC Diagnoses   Final diagnoses:  Tinea pedis of both feet     Discharge Instructions      Patient will need to follow-up with podiatry Discussed with patient he will need to apply cream twice a day wear cotton socks make sure the area is washed and dried well This may take several months to heal Continue to use over-the-counter powder or spray     ED Prescriptions     Medication Sig Dispense Auth. Provider   clotrimazole-betamethasone (LOTRISONE) cream Apply to affected area 2 times daily prn 15 g Maple Mirza L, NP   terbinafine (LAMISIL) 250 MG tablet Take 1 tablet (250 mg total) by mouth daily. 15 tablet Coralyn Mark, NP      PDMP not reviewed this encounter.   Coralyn Mark, NP 07/02/22 [phone]

## 2022-07-02 NOTE — Discharge Instructions (Addendum)
Patient will need to follow-up with podiatry Discussed with patient he will need to apply cream twice a day wear cotton socks make sure the area is washed and dried well This may take several months to heal Continue to use over-the-counter powder or spray

## 2022-07-02 NOTE — ED Triage Notes (Signed)
Pt reports itching rash in foot and toes x 2-3 months. 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 Hydrocortisone cream gives no relief.

## 2022-08-28 ENCOUNTER — Encounter: Payer: Self-pay | Admitting: *Deleted

## 2022-08-28 ENCOUNTER — Ambulatory Visit
Admission: EM | Admit: 2022-08-28 | Discharge: 2022-08-28 | Disposition: A | Discharge status: Home / Self Care | Payer: Commercial Managed Care - HMO | Attending: Internal Medicine | Admitting: Internal Medicine

## 2022-08-28 ENCOUNTER — Other Ambulatory Visit: Payer: Self-pay

## 2022-08-28 DIAGNOSIS — R1084 Generalized abdominal pain: Secondary | ICD-10-CM

## 2022-08-28 DIAGNOSIS — Z8719 Personal history of other diseases of the digestive system: Secondary | ICD-10-CM

## 2022-08-28 DIAGNOSIS — B353 Tinea pedis: Secondary | ICD-10-CM

## 2022-08-28 MED ORDER — CIPROFLOXACIN HCL 500 MG PO TABS: 500.0000 mg | ORAL_TABLET | Freq: Two times a day (BID) | ORAL | 0 refills | Status: AC

## 2022-08-28 MED ORDER — CICLOPIROX OLAMINE 0.77 % EX CREA: TOPICAL_CREAM | Freq: Two times a day (BID) | CUTANEOUS | 0 refills | Status: DC

## 2022-08-28 MED ORDER — METRONIDAZOLE 500 MG PO TABS: 500.0000 mg | ORAL_TABLET | Freq: Three times a day (TID) | ORAL | 0 refills | Status: AC

## 2022-08-28 NOTE — Discharge Instructions (Signed)
I have sent in 2 antibiotics to treat diverticulitis.  Blood work is pending as well.  If symptoms persist or worsen, please go straight to the emergency department.   I have prescribed you cream to apply to feet.  Follow-up with podiatrist as soon as possible.

## 2022-08-28 NOTE — ED Provider Notes (Signed)
[facility]    CSN: [id number] Arrival date & time: 08/28/22  1217      History   Chief Complaint Chief Complaint  Patient presents with   Abdominal Pain    HPI ELIM PIEL is a 47 y.o. male.   Patient presents with 2 different chief complaints today.  Patient reports that he has a flareup of athlete's foot that started about a week ago.  Reports that he was using clotrimazole-betamethasone cream that was previously prescribed up until 4 days ago.  Reports this cream typically is very helpful.  Patient has been seen multiple times at urgent care for athlete's foot.  He has taken Diflucan, topical creams, terbinafine with intermittent improvement.  He was advised to follow-up with podiatry but reports he has not yet done this.  Patient also states that he is having generalized abdominal pain that radiates down to his groin that started about 1.5 weeks ago.  States that he has a history of diverticulitis and this feels very similar.  Reports pain is rated 3/10 on pain scale and is intermittent.  Denies any fever.  Reports that he had some mild nausea with no vomiting.  Patient having normal bowel movements and no blood in stool.  Patient states that he ate some general [employer] chicken which he thinks flared it up.  He had previously prescribed ciprofloxacin and metronidazole left over for 2-day supply which he took with minimal improvement.   Abdominal Pain   Past Medical History:  Diagnosis Date   Diverticulitis    GERD (gastroesophageal reflux disease)     Patient Active Problem List   Diagnosis Date Noted   Diverticulitis of colon 07/01/2019    Past Surgical History:  Procedure Laterality Date   LUMBAR LAMINECTOMY/DECOMPRESSION MICRODISCECTOMY Right 11/03/2015   Procedure: RIGHT SIDED LUMBAR 5-SACRUM 1 MICRODISCECTOMY;  Surgeon: Estill Bamberg, MD;  Location: [facility] OR;  Service: Orthopedics;  Laterality: Right;  RIGHT SIDED LUMBAR 5-SACRUM 1 MICRODISCECTOMY    ORIF ANKLE FRACTURE Right 06/19/2021   Procedure: OPEN REDUCTION INTERNAL FIXATION (ORIF) ANKLE FRACTURE;  Surgeon: Netta Cedars, MD;  Location: [facility] OR;  Service: Orthopedics;  Laterality: Right;   WISDOM TOOTH EXTRACTION     2 taken out       Home Medications    Prior to Admission medications   Medication Sig Start Date End Date Taking? Authorizing Provider  ciclopirox (LOPROX) 0.77 % cream Apply topically 2 (two) times daily. 08/28/22  Yes Emanie Behan, Rolly Salter E, FNP  ciprofloxacin (CIPRO) 500 MG tablet Take 1 tablet (500 mg total) by mouth every 12 (twelve) hours for 10 days. 08/28/22 09/07/22 Yes Hessie Varone, Acie Fredrickson, FNP  metroNIDAZOLE (FLAGYL) 500 MG tablet Take 1 tablet (500 mg total) by mouth every 8 (eight) hours for 10 days. 08/28/22 09/07/22 Yes Oswin Johal, Rolly Salter E, FNP  diclofenac Sodium (VOLTAREN) 1 % GEL Apply 2 g topically 4 (four) times daily. Patient not taking: Reported on 06/16/2021 03/03/20   Dietrich Pates, PA-C  econazole nitrate 1 % cream Apply topically 2 (two) times daily. 02/07/22   Ellsworth Lennox, PA-C  famotidine (PEPCID) 20 MG tablet Take 1 tablet (20 mg total) by mouth 2 (two) times daily. Patient not taking: Reported on 06/16/2021 02/26/20   Harlene Salts A, PA-C  ibuprofen (ADVIL) 800 MG tablet Take 800 mg by mouth every 6 (six) hours as needed for mild pain.    [provider]  magic mouthwash (lidocaine, diphenhydrAMINE, alum & mag hydroxide) suspension Swish and swallow 10  mLs 4 (four) times daily. 04/10/22   Ellsworth Lennox, PA-C  methocarbamol (ROBAXIN) 500 MG tablet Take 1 tablet (500 mg total) by mouth 2 (two) times daily. Patient not taking: Reported on 06/16/2021 03/03/20   Dietrich Pates, PA-C  metroNIDAZOLE (FLAGYL) 500 MG tablet Take 1 tablet (500 mg total) by mouth 2 (two) times daily. Patient taking differently: Take 500 mg by mouth daily as needed (diverticulitis). 09/07/20   Meryl Dare, MD  oxyCODONE-acetaminophen (PERCOCET) 5-325 MG tablet Take 1 tablet by mouth  every 4 (four) hours as needed. 06/12/21   Garlon Hatchet, PA-C  pantoprazole (PROTONIX) 20 MG tablet Take 1 tablet (20 mg total) by mouth daily. Patient taking differently: Take 20 mg by mouth daily as needed (diverticulitis). 02/26/20   Harlene Salts A, PA-C  terbinafine (LAMISIL) 250 MG tablet Take 1 tablet (250 mg total) by mouth daily. 07/02/22   Coralyn Mark, NP    Family History Family History  Problem Relation Age of Onset   Diabetes Father    Stomach cancer Maternal Grandmother    Alcohol abuse Maternal Grandfather    Diabetes Paternal Grandmother    Diabetes Paternal Grandfather    Colon cancer Neg Hx    Colon polyps Neg Hx    Esophageal cancer Neg Hx    Rectal cancer Neg Hx     Social History Social History   Tobacco Use   Smoking status: Every Day    Current packs/day: 0.50    Types: Cigarettes   Smokeless tobacco: Never  Vaping Use   Vaping status: Never Used  Substance Use Topics   Alcohol use: Yes    Comment: mainly on weekends   Drug use: Yes    Types: Marijuana, Cocaine    Comment: last cocaine and marijuana 1 week     Allergies   Patient has no known allergies.   Review of Systems Review of Systems Per HPI  Physical Exam Triage Vital Signs ED Triage Vitals  Encounter Vitals Group     BP 08/28/22 1245 (!) 139/98     Systolic BP Percentile --      Diastolic BP Percentile --      Pulse Rate 08/28/22 1245 86     Resp 08/28/22 1245 18     Temp 08/28/22 1245 97.9 F (36.6 C)     Temp Source 08/28/22 1245 Oral     SpO2 08/28/22 1245 95 %     Weight --      Height --      Head Circumference --      Peak Flow --      Pain Score 08/28/22 1243 3     Pain Loc --      Pain Education --      Exclude from Growth Chart --    No data found.  Updated Vital Signs BP (!) 139/98 (BP Location: Right Arm)   Pulse 86   Temp 97.9 F (36.6 C) (Oral)   Resp 18   SpO2 95%   Visual Acuity Right Eye Distance:   Left Eye Distance:    Bilateral Distance:    Right Eye Near:   Left Eye Near:    Bilateral Near:     Physical Exam Constitutional:      General: He is not in acute distress.    Appearance: Normal appearance. He is not toxic-appearing or diaphoretic.  HENT:     Head: Normocephalic and atraumatic.  Eyes:     Extraocular  Movements: Extraocular movements intact.     Conjunctiva/sclera: Conjunctivae normal.  Cardiovascular:     Rate and Rhythm: Normal rate and regular rhythm.     Pulses: Normal pulses.     Heart sounds: Normal heart sounds.  Pulmonary:     Effort: Pulmonary effort is normal. No respiratory distress.     Breath sounds: Normal breath sounds.  Abdominal:     General: Bowel sounds are normal. There is no distension.     Palpations: Abdomen is soft.     Tenderness: There is no abdominal tenderness.  Feet:     Comments: Patient has erythematous rash in between toes and distal dorsal surface of feet bilaterally. Neurological:     General: No focal deficit present.     Mental Status: He is alert and oriented to person, place, and time. Mental status is at baseline.  Psychiatric:        Mood and Affect: Mood normal.        Behavior: Behavior normal.        Thought Content: Thought content normal.        Judgment: Judgment normal.      UC Treatments / Results  Labs (all labs ordered are listed, but only abnormal results are displayed) Labs Reviewed  CBC  COMPREHENSIVE METABOLIC PANEL    EKG   Radiology No results found.  Procedures Procedures (including critical care time)  Medications Ordered in UC Medications - No data to display  Initial Impression / Assessment and Plan / UC Course  I have reviewed the triage vital signs and the nursing notes.  Pertinent labs & imaging results that were available during my care of the patient were reviewed by me and considered in my medical decision making (see chart for details).     1.  Abdominal pain  Discussed with patient  that it is risky to treat and evaluate etiology such as diverticulitis in urgent care given we do not have capabilities of stat blood work and imaging of the abdomen.  Although, given patient has history of diverticulitis and has been treated previously at urgent care for diverticulitis with success, I do think it is reasonable to treat patient with Cipro and Flagyl today given he has had resolution of symptoms with this previously.  Patient is also not exhibiting any signs of acute abdomen or fever with no obvious tenderness to palpation to abdomen on exam which is reassuring.  Will obtain a CMP and CBC.  Awaiting results.  Cipro and Flagyl prescribed for patient to start taking to treat suspicion of diverticulitis.  Advised strict ER precautions and follow-up with his established GI doctor as well.  2.  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  Patient reported to me that he had been using clotrimazole-betamethasone cream for multiple months which could be risky given steroid component.  Therefore, will prescribe ciclopirox to see if this will be helpful.  Also advised avoiding dark-colored socks and moisture to the feet.  Patient advised of the importance of following up with podiatry at provided contact information given this is a recurrent issue.  Advised return precautions for all chief complaints today.  Patient verbalized understanding and was agreeable with plan. Final diagnoses:  Generalized abdominal pain  History of diverticulitis  Tinea pedis of both feet     Discharge Instructions      I have sent in 2 antibiotics to treat diverticulitis.  Blood work is pending as well.  If symptoms  persist or worsen, please go straight to the emergency department.   I have prescribed you cream to apply to feet.  Follow-up with podiatrist as soon as possible.    ED Prescriptions     Medication Sig Dispense Auth. Provider   ciprofloxacin (CIPRO) 500 MG tablet Take 1 tablet (500 mg  total) by mouth every 12 (twelve) hours for 10 days. 20 tablet [place], [place] E, [place]   metroNIDAZOLE (FLAGYL) 500 MG tablet Take 1 tablet (500 mg total) by mouth every 8 (eight) hours for 10 days. 30 tablet [place], [place] E, [place]   ciclopirox (LOPROX) 0.77 % cream Apply topically 2 (two) times daily. 30 g Gustavus Bryant, [place]      PDMP not reviewed this encounter.   Gustavus Bryant, [place] 08/28/22 [phone]

## 2022-08-28 NOTE — ED Triage Notes (Signed)
Pt states he is having "diverticulitis flare-up" x 1.5 weeks. States sx started after eating chinese food. C/o generalized abd pain and discomfort "down into my penis". States only had enough meds for 2 days. Also requesting medication for athlete's foot

## 2022-08-29 LAB — CBC
Hematocrit: 44.9 % (ref 37.5–51.0)
Hemoglobin: 15.3 g/dL (ref 13.0–17.7)
MCH: 30.2 pg (ref 26.6–33.0)
MCHC: 34.1 g/dL (ref 31.5–35.7)
MCV: 89 fL (ref 79–97)
Platelets: 249 10*3/uL (ref 150–450)
RBC: 5.07 x10E6/uL (ref 4.14–5.80)
RDW: 13 % (ref 11.6–15.4)
WBC: 7.5 10*3/uL (ref 3.4–10.8)

## 2022-08-29 LAB — COMPREHENSIVE METABOLIC PANEL
ALT: 15 IU/L (ref 0–44)
AST: 21 IU/L (ref 0–40)
Albumin: 4.4 g/dL (ref 4.1–5.1)
Alkaline Phosphatase: 91 IU/L (ref 44–121)
BUN/Creatinine Ratio: 14 (ref 9–20)
BUN: 13 mg/dL (ref 6–24)
Bilirubin Total: 0.3 mg/dL (ref 0.0–1.2)
CO2: 23 mmol/L (ref 20–29)
Calcium: 9.7 mg/dL (ref 8.7–10.2)
Chloride: 99 mmol/L (ref 96–106)
Creatinine, Ser: 0.91 mg/dL (ref 0.76–1.27)
Globulin, Total: 3.2 g/dL (ref 1.5–4.5)
Glucose: 91 mg/dL (ref 70–99)
Potassium: 4.3 mmol/L (ref 3.5–5.2)
Sodium: 136 mmol/L (ref 134–144)
Total Protein: 7.6 g/dL (ref 6.0–8.5)
eGFR: 105 mL/min/{1.73_m2} (ref >59)

## 2023-04-13 IMAGING — DX DG ANKLE 2V *R*
2 series · 2 of 2 positions shown · non-contrast
Comparison: [DATE] [DATE], [DATE] ([DATE] a.m.)

CLINICAL DATA: Post reduction images.

EXAM:
RIGHT ANKLE - 2 VIEW

[ankle ap]
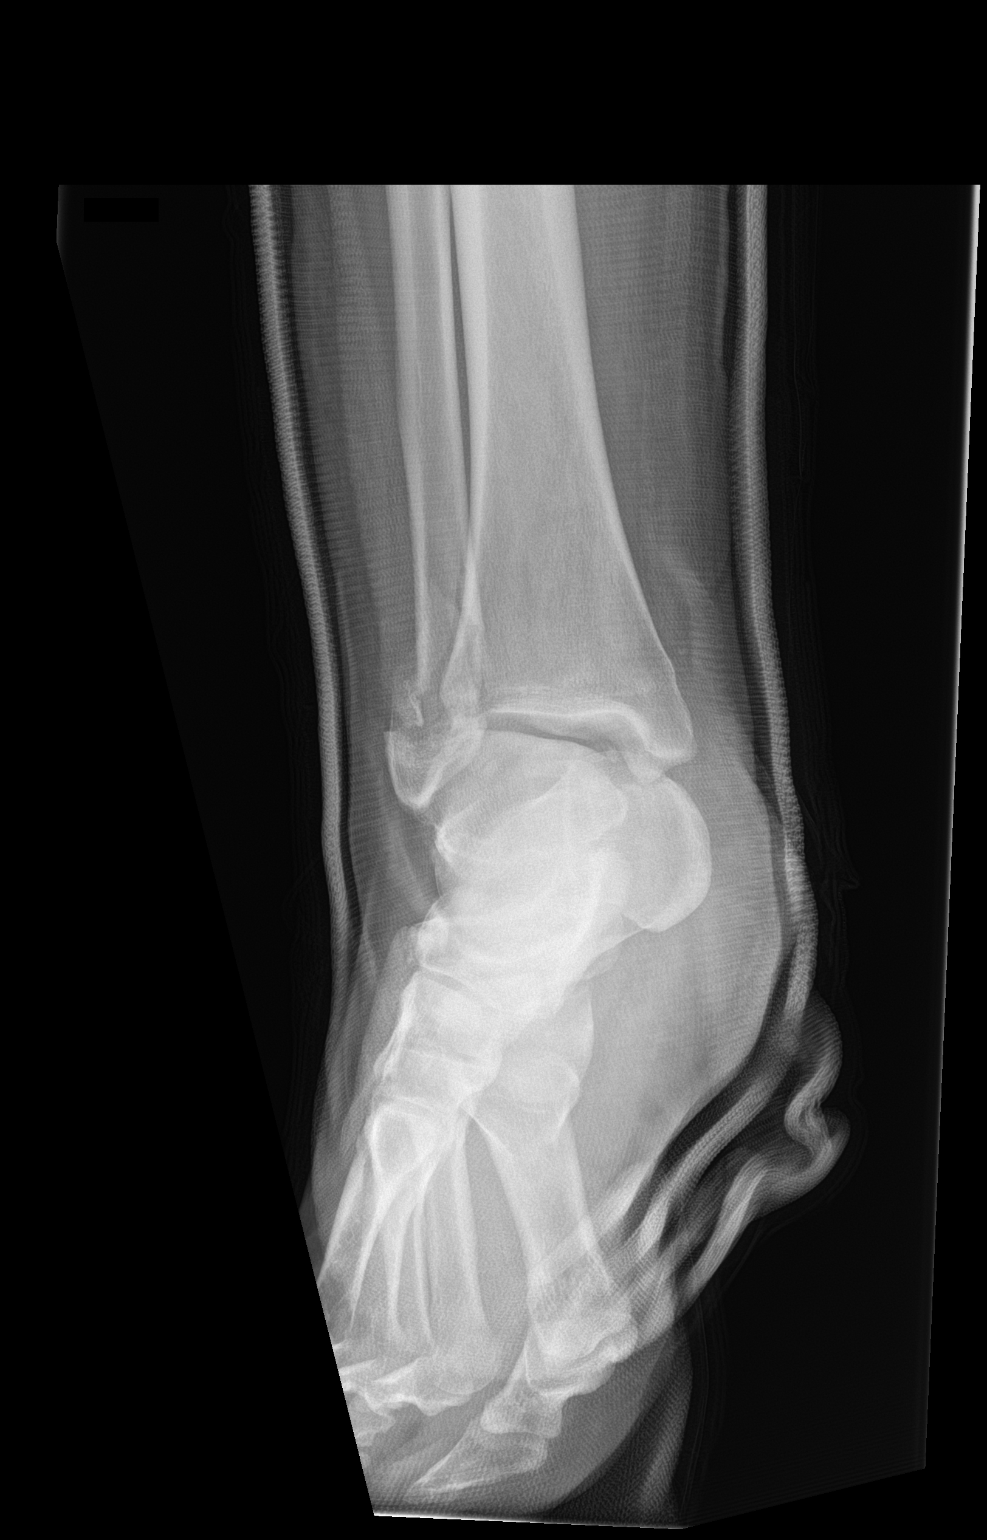

[ankle lat]
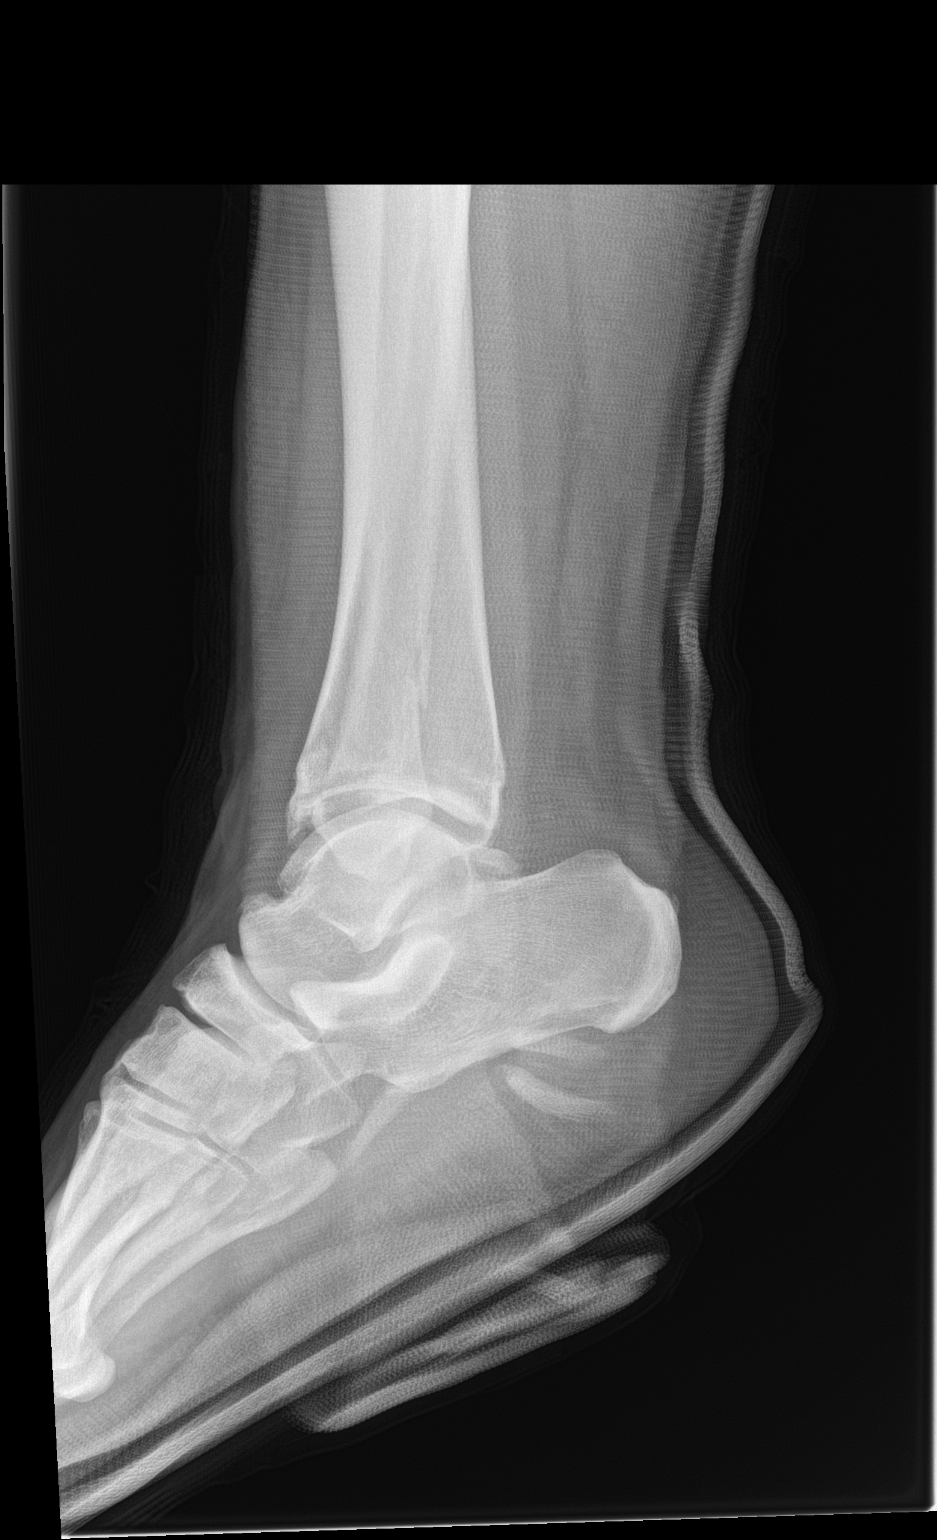

[2 of 2 positions shown; findings below may reference images not displayed]

FINDINGS: The right ankle was imaged in a fiberglass cast with subsequently
obscured osseous and soft tissue detail. Acute fracture deformities
of the right lateral malleolus and right medial malleolus are seen
with gross anatomic alignment. There is asymmetric widening of the
right ankle mortise without evidence of dislocation. Diffuse soft
tissue swelling is noted.
IMPRESSION: Acute fractures of the right lateral malleolus and right medial
malleolus with interval reduction of the lateral ankle dislocation
seen on the prior study.

## 2023-04-20 IMAGING — RF DG ANKLE 2V *R*
1 series · 6 of 6 positions shown · non-contrast
Comparison: None Available.

CLINICAL DATA: 118651.  Right ankle ORIF

EXAM:
RIGHT ANKLE - 2 VIEW

[Series 1: run · 6 of 6 slices shown]
[im 1/6]
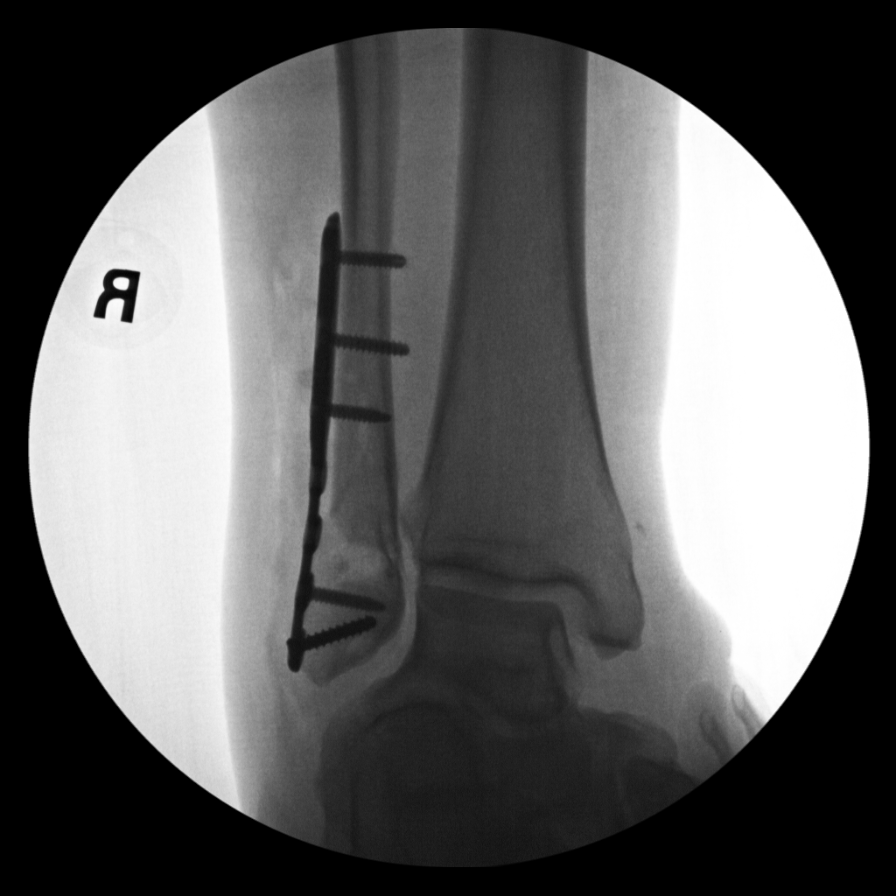
[im 2/6]
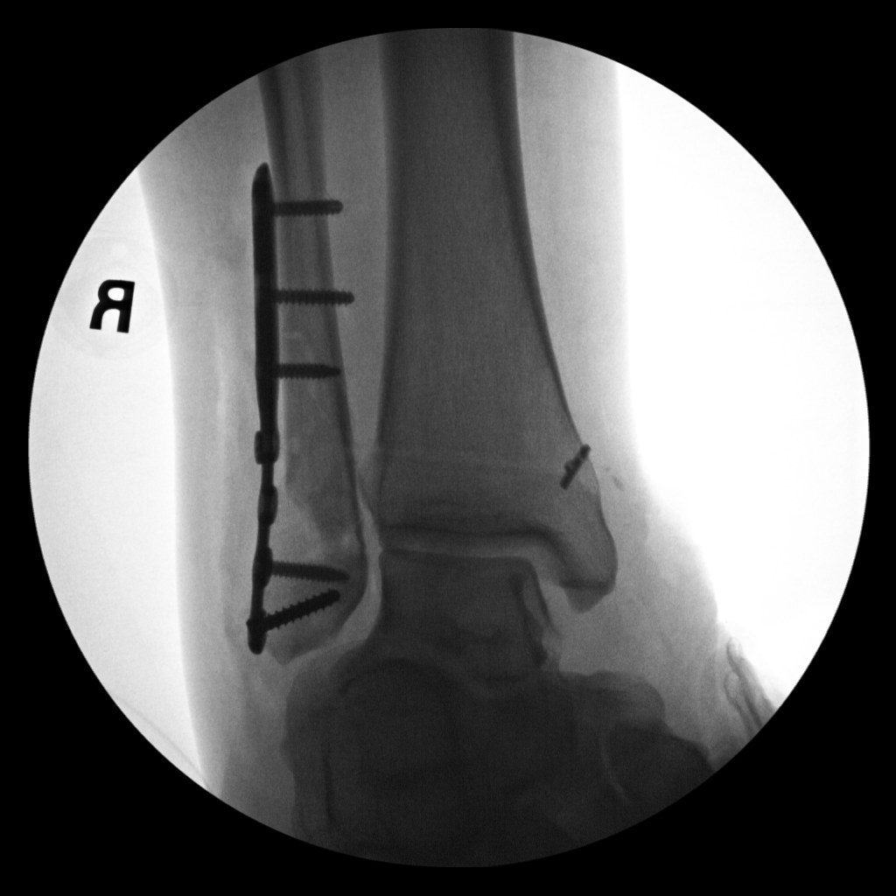
[im 3/6]
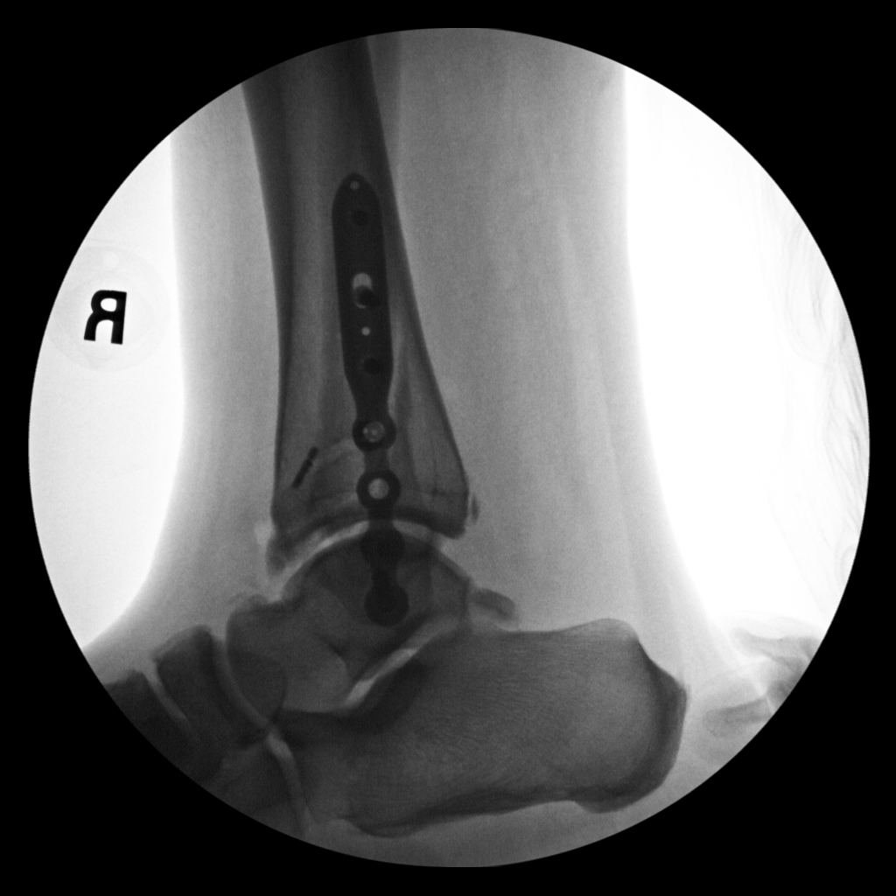
[im 4/6]
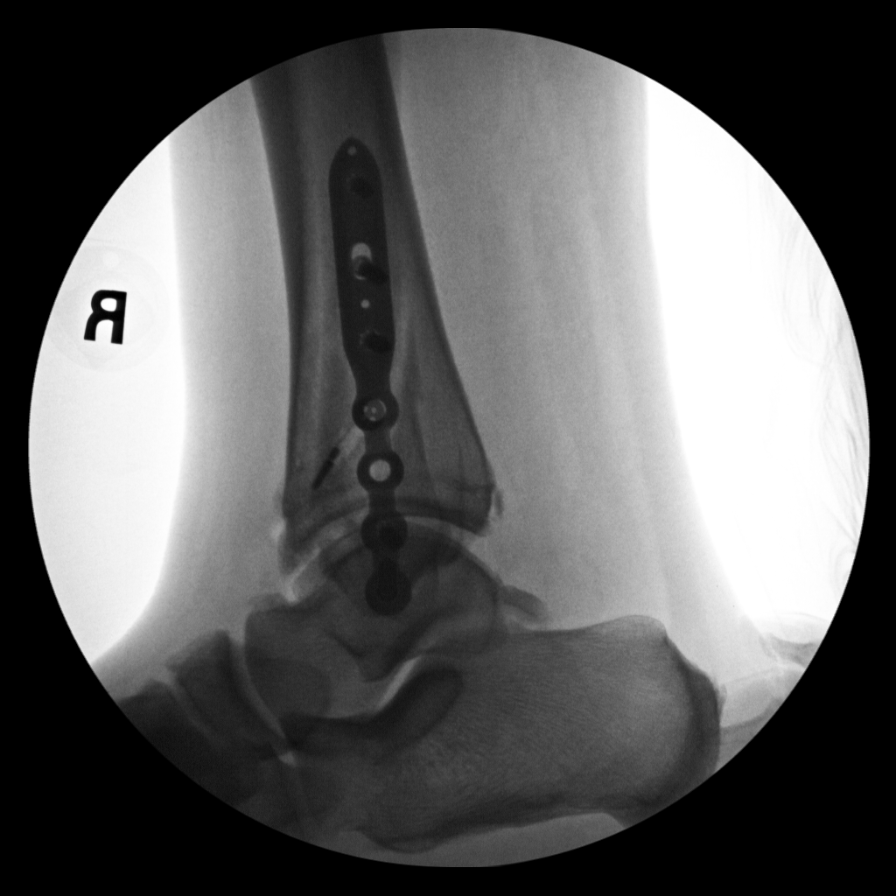
[im 5/6]
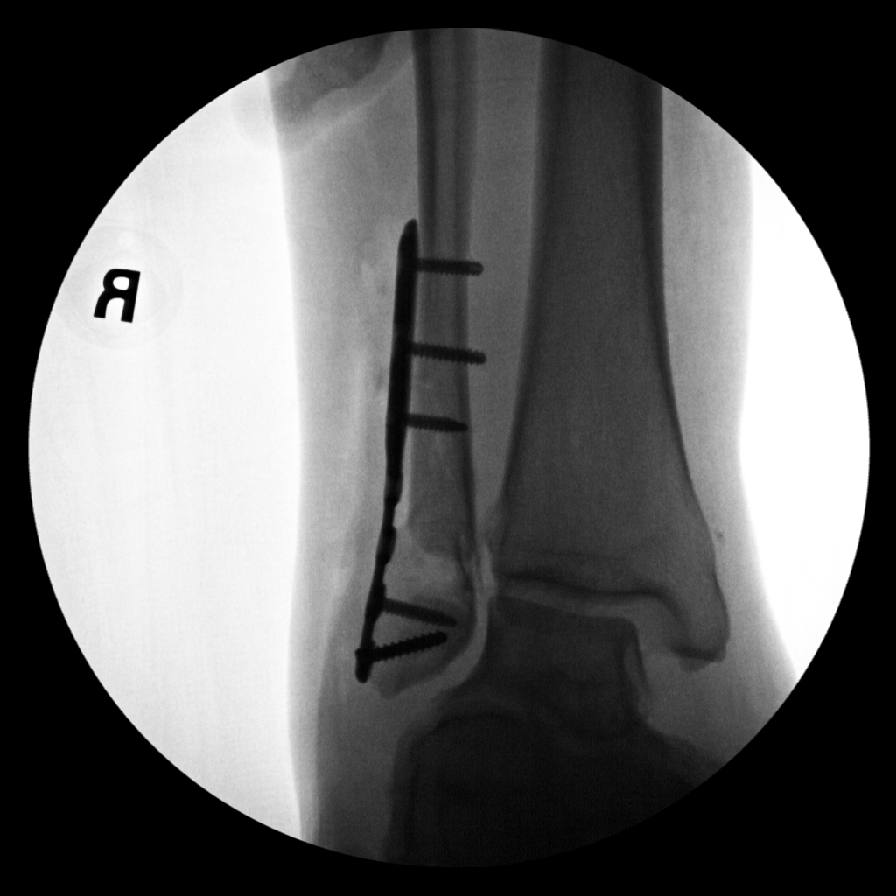
[im 6/6]
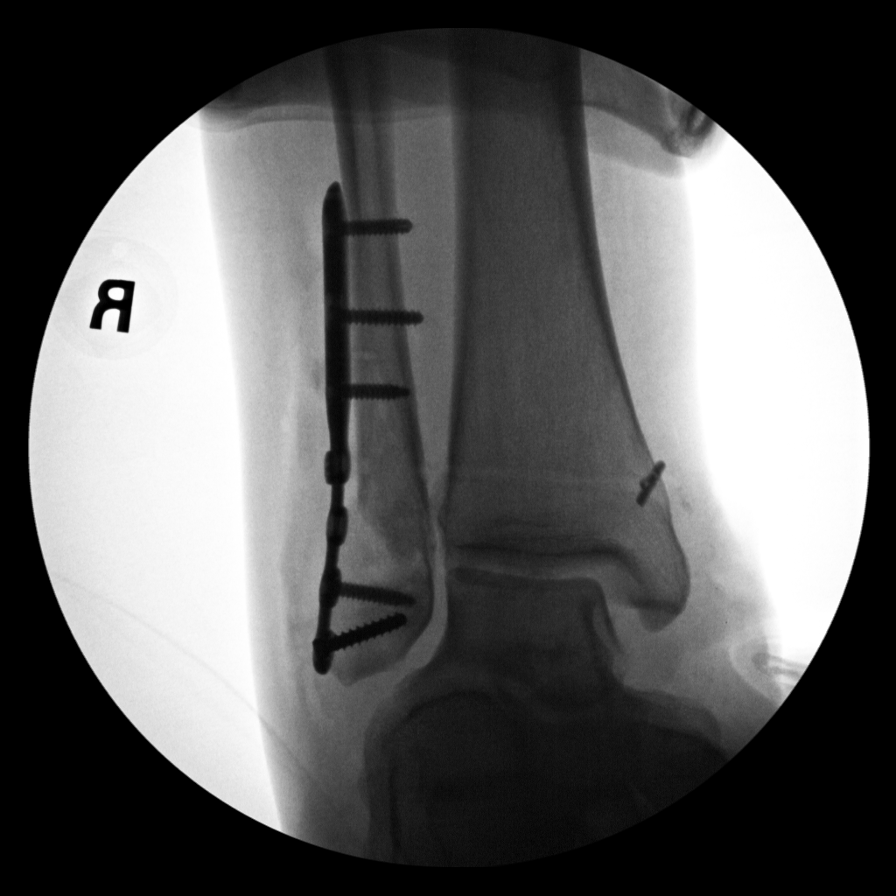

[6 of 6 positions shown; findings below may reference images not displayed]

FINDINGS: Six fluoroscopic intraoperative radiographs demonstrates ORIF a
distal right fibular fracture utilizing a lateral cortical plate and
multiple screws as well as type rope reconstruction of the distal
tibia fibular syndesmosis. Sizable defect noted within the distal
right fibular metaphyseal region. Tiny anatomic aligned fracture of
the posterior malleolus again noted. Widening of the medial joint
space again noted on final image.

Fluoroscopic images: 6

Fluoroscopy time: 249.9 seconds

Fluoroscopic dose: 8.68 mGy
IMPRESSION: Right ankle ORIF as described above.

## 2023-06-01 ENCOUNTER — Ambulatory Visit: Admission: EM | Admit: 2023-06-01 | Discharge: 2023-06-01 | Disposition: A | Discharge status: Home / Self Care

## 2023-06-01 DIAGNOSIS — B353 Tinea pedis: Secondary | ICD-10-CM

## 2023-06-01 DIAGNOSIS — R634 Abnormal weight loss: Secondary | ICD-10-CM

## 2023-06-01 DIAGNOSIS — K5730 Diverticulosis of large intestine without perforation or abscess without bleeding: Secondary | ICD-10-CM | POA: Diagnosis not present

## 2023-06-01 DIAGNOSIS — N12 Tubulo-interstitial nephritis, not specified as acute or chronic: Secondary | ICD-10-CM

## 2023-06-01 DIAGNOSIS — R1114 Bilious vomiting: Secondary | ICD-10-CM

## 2023-06-01 MED ORDER — CICLOPIROX OLAMINE 0.77 % EX CREA: TOPICAL_CREAM | Freq: Two times a day (BID) | CUTANEOUS | 2 refills | Status: AC

## 2023-06-01 MED ORDER — SODIUM CHLORIDE 0.9 % IV BOLUS: 1000.0000 mL | Freq: Once | INTRAVENOUS | Status: DC

## 2023-06-01 MED ORDER — SODIUM CHLORIDE 0.9 % IV BOLUS: 500.0000 mL | Freq: Once | INTRAVENOUS | Status: DC

## 2023-06-01 NOTE — Discharge Instructions (Addendum)
 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  This has responded well in the past to Ciclopirox .  Will prescribe this for twice daily.  Several refills have been included.  Recommend following up with a dermatologist given the recurrent nature of the athlete's foot.  We have sent a referral to establish care with a primary care physician to discuss prescribing as needed antibiotics.  Do not feel that this is indicated in an urgent care setting as there is currently no symptoms of diverticulitis and also typically concerns for diverticulitis should be evaluated in the emergency department where advanced imaging and stat lab work can be performed to ensure that this is the actual condition that needs to be treated and that there is no complications that might need be surgical emergency.  Would recommend considering following up with a general surgeon to discuss recurrent diverticulitis as there may be a surgical intervention that could be done in a nonemergency situation to remove part of the large intestine that has the diverticulitis.

## 2023-06-01 NOTE — ED Triage Notes (Signed)
"  I have bad athlete's feet (both) and I want to restart my medication for my stomach (Flagyl ) & Cipro  (?)". No PCP (discussed).

## 2023-06-01 NOTE — ED Provider Notes (Signed)
 [facility]    CSN: [id number] Arrival date & time: 06/01/23  0858      History   Chief Complaint Chief Complaint  Patient presents with   Feet Problem   Medication Refill    HPI Shane Arroyo is a 48 y.o. male.   48 year old male who presents urgent care with complaints of severe itching and rash on both feet.  He reports that this has been going on for months.  He has tried over-the-counter medication multiple times without relief.  He has had to use prescription medications in the past.  He denies any fevers or chills associated with this.  He has not seen dermatology for this.  He does not have a primary care physician.  He is also requesting a refill of his Cipro  and metronidazole  for his diverticulitis although he is not having any symptoms right now.  He has not seen general surgery for this and does not have a primary care doctor.  He denies nausea, vomiting, no abdominal pain, diarrhea, fevers, chills.   Medication Refill   Past Medical History:  Diagnosis Date   Diverticulitis    GERD (gastroesophageal reflux disease)     Patient Active Problem List   Diagnosis Date Noted   Sprain of ankle 07/18/2021   Arthralgia of right ankle 06/29/2021   Closed trimalleolar fracture of right ankle 06/16/2021   Diverticulitis of colon 07/01/2019    Past Surgical History:  Procedure Laterality Date   LUMBAR LAMINECTOMY/DECOMPRESSION MICRODISCECTOMY Right 11/03/2015   Procedure: RIGHT SIDED LUMBAR 5-SACRUM 1 MICRODISCECTOMY;  Surgeon: Virl Grimes, MD;  Location: [facility] OR;  Service: Orthopedics;  Laterality: Right;  RIGHT SIDED LUMBAR 5-SACRUM 1 MICRODISCECTOMY   ORIF ANKLE FRACTURE Right 06/19/2021   Procedure: OPEN REDUCTION INTERNAL FIXATION (ORIF) ANKLE FRACTURE;  Surgeon: Ali Ink, MD;  Location: [facility] OR;  Service: Orthopedics;  Laterality: Right;   WISDOM TOOTH EXTRACTION     2 taken out       Home Medications    Prior to Admission  medications   Medication Sig Start Date End Date Taking? Authorizing Provider  aspirin EC 81 MG tablet Take 81 mg by mouth daily. 06/18/21  Yes [provider]  docusate sodium (COLACE) 100 MG capsule Take 100 mg by mouth 2 (two) times daily. 06/18/21  Yes [provider]  Naproxen  Sodium (ALEVE ) 220 MG CAPS Take 220 mg by mouth every 12 (twelve) hours. 07/18/21  Yes [provider]  ondansetron  (ZOFRAN ) 8 MG tablet Take 8 mg by mouth every 8 (eight) hours as needed. 06/18/21  Yes [provider]  ondansetron  (ZOFRAN ) 8 MG tablet Take 8 mg by mouth every 8 (eight) hours as needed. 06/18/21  Yes [provider]  cephALEXin (KEFLEX) 500 MG capsule Take 500 mg by mouth 4 (four) times daily.    [provider]  ciclopirox  (LOPROX ) 0.77 % cream Apply topically 2 (two) times daily. 06/01/23   Anaiz Qazi A, PA-C  ciprofloxacin  (CIPRO ) 500 MG tablet Take 500 mg by mouth 2 (two) times daily.    [provider]  diclofenac  Sodium (VOLTAREN ) 1 % GEL Apply 2 g topically 4 (four) times daily. Patient not taking: Reported on 06/16/2021 03/03/20   Corena Devon, PA-C  econazole nitrate  1 % cream Apply topically 2 (two) times daily. 02/07/22   Gretel Leaven, PA-C  famotidine  (PEPCID ) 20 MG tablet Take 1 tablet (20 mg total) by mouth 2 (two) times daily. Patient not taking: Reported on 06/16/2021  02/26/20   Hadassah Letters A, PA-C  fluconazole  (DIFLUCAN ) 150 MG tablet Take 150 mg by mouth once.    [provider]  ibuprofen  (ADVIL ) 800 MG tablet Take 800 mg by mouth every 6 (six) hours as needed for mild pain.    [provider]  magic mouthwash (lidocaine , diphenhydrAMINE , alum & mag hydroxide) suspension Swish and swallow 10 mLs 4 (four) times daily. 04/10/22   Gretel Leaven, PA-C  methocarbamol  (ROBAXIN ) 500 MG tablet Take 1 tablet (500 mg total) by mouth 2 (two) times daily. Patient not taking: Reported on 06/16/2021 03/03/20   Khatri, Hina,  PA-C  metroNIDAZOLE  (FLAGYL ) 500 MG tablet Take 1 tablet (500 mg total) by mouth 2 (two) times daily. Patient taking differently: Take 500 mg by mouth daily as needed (diverticulitis). 09/07/20   Asencion Blacksmith, MD  oxyCODONE  (OXY IR/ROXICODONE ) 5 MG immediate release tablet Take 5 mg by mouth every 6 (six) hours as needed.    [provider]  oxyCODONE -acetaminophen  (PERCOCET) 5-325 MG tablet Take 1 tablet by mouth every 4 (four) hours as needed. 06/12/21   Coretha Dew, PA-C  pantoprazole  (PROTONIX ) 20 MG tablet Take 1 tablet (20 mg total) by mouth daily. Patient taking differently: Take 20 mg by mouth daily as needed (diverticulitis). 02/26/20   Hadassah Letters A, PA-C  terbinafine  (LAMISIL ) 250 MG tablet Take 1 tablet (250 mg total) by mouth daily. 07/02/22   Harden Leyden, NP    Family History Family History  Problem Relation Age of Onset   Diabetes Father    Stomach cancer Maternal Grandmother    Alcohol abuse Maternal Grandfather    Diabetes Paternal Grandmother    Diabetes Paternal Grandfather    Colon cancer Neg Hx    Colon polyps Neg Hx    Esophageal cancer Neg Hx    Rectal cancer Neg Hx     Social History Social History   Tobacco Use   Smoking status: Every Day    Current packs/day: 0.50    Types: Cigarettes   Smokeless tobacco: Never  Vaping Use   Vaping status: Never Used  Substance Use Topics   Alcohol use: Yes    Comment: mainly on weekends   Drug use: Not Currently    Types: Marijuana, Cocaine    Comment: last cocaine and marijuana 1 week     Allergies   Patient has no known allergies.   Review of Systems Review of Systems  Constitutional:  Negative for chills and fever.  HENT:  Negative for ear pain and sore throat.   Eyes:  Negative for pain and visual disturbance.  Respiratory:  Negative for cough and shortness of breath.   Cardiovascular:  Negative for chest pain and palpitations.  Gastrointestinal:  Negative for abdominal pain  and vomiting.  Genitourinary:  Negative for dysuria and hematuria.  Musculoskeletal:  Negative for arthralgias and back pain.  Skin:  Negative for color change and rash.  Neurological:  Negative for seizures and syncope.  All other systems reviewed and are negative.    Physical Exam Triage Vital Signs ED Triage Vitals  Encounter Vitals Group     BP 06/01/23 0931 (!) 129/91     Systolic BP Percentile --      Diastolic BP Percentile --      Pulse Rate 06/01/23 0931 90     Resp 06/01/23 0931 18     Temp 06/01/23 0931 98.6 F (37 C)     Temp Source 06/01/23 0931  Oral     SpO2 06/01/23 0931 99 %     Weight 06/01/23 0929 260 lb (117.9 kg)     Height 06/01/23 0929 5\' 9"  (1.753 m)     Head Circumference --      Peak Flow --      Pain Score --      Pain Loc --      Pain Education --      Exclude from Growth Chart --    No data found.  Updated Vital Signs BP (!) 129/91 (BP Location: Right Arm)   Pulse 90   Temp 98.6 F (37 C) (Oral)   Resp 18   Ht 5\' 9"  (1.753 m)   Wt 260 lb (117.9 kg)   SpO2 99%   BMI 38.40 kg/m   Visual Acuity Right Eye Distance:   Left Eye Distance:   Bilateral Distance:    Right Eye Near:   Left Eye Near:    Bilateral Near:     Physical Exam Vitals and nursing note reviewed.  Constitutional:      General: He is not in acute distress.    Appearance: He is well-developed.  HENT:     Head: Normocephalic and atraumatic.  Eyes:     Conjunctiva/sclera: Conjunctivae normal.  Cardiovascular:     Rate and Rhythm: Normal rate and regular rhythm.     Heart sounds: No murmur heard. Pulmonary:     Effort: Pulmonary effort is normal. No respiratory distress.     Breath sounds: Normal breath sounds.  Abdominal:     General: Bowel sounds are normal. There is no distension.     Palpations: Abdomen is soft.     Tenderness: There is no abdominal tenderness. There is left CVA tenderness. There is no guarding or rebound.  Musculoskeletal:         General: No swelling.     Cervical back: Neck supple.  Feet:     Comments: Bilateral feet with erythematous rash between the toes, scratching is evident with some scabbed areas, no immediate signs of infection Skin:    General: Skin is warm and dry.     Capillary Refill: Capillary refill takes less than 2 seconds.  Neurological:     Mental Status: He is alert.  Psychiatric:        Mood and Affect: Mood normal.      UC Treatments / Results  Labs (all labs ordered are listed, but only abnormal results are displayed) Labs Reviewed - No data to display  EKG   Radiology No results found.  Procedures Procedures (including critical care time)  Medications Ordered in UC Medications - No data to display  Initial Impression / Assessment and Plan / UC Course  I have reviewed the triage vital signs and the nursing notes.  Pertinent labs & imaging results that were available during my care of the patient were reviewed by me and considered in my medical decision making (see chart for details).     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  This has responded well in the past to Ciclopirox .  Will prescribe this for twice daily.  Several refills have been  included.  Recommend following up with a dermatologist given the recurrent nature of the athlete's foot.  We have sent a referral to establish care with a primary care physician to discuss prescribing as needed antibiotics.  Do not feel that this is indicated in an urgent care setting as there is currently no symptoms of diverticulitis and also typically concerns for  diverticulitis should be evaluated in the emergency department where advanced imaging and stat lab work can be performed to ensure that this is the actual condition that needs to be treated and that there is no complications that might need be surgical emergency.  Would recommend considering following up with a general surgeon to discuss recurrent diverticulitis as there may be a surgical intervention that could be done in a nonemergency situation to remove part of the large intestine that has the diverticulitis.  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  This has responded well in the past to Ciclopirox .  Will prescribe this for twice daily.  Several refills have been included.  Recommend following up with a dermatologist given the recurrent nature of the athlete's foot.  We have sent a referral to establish care with a primary care physician to discuss prescribing as needed antibiotics.  Do not feel that this is indicated in an urgent care setting as there is currently no symptoms of diverticulitis and also typically concerns for diverticulitis should be evaluated in the emergency department where advanced imaging and stat lab work can be performed to ensure that this is the actual condition that needs to be treated and that there is no complications that might need be surgical emergency.  Would recommend considering following up with a general surgeon to discuss recurrent diverticulitis as there may be a surgical intervention that could be done in a nonemergency situation to remove part of the large intestine that has the diverticulitis.   ED Prescriptions     Medication Sig Dispense Auth. Provider   ciclopirox  (LOPROX ) 0.77 % cream Apply topically 2 (two)  times daily. 30 g Kreg Pesa, [place]      PDMP not reviewed this encounter.   Kreg Pesa, [place] 06/01/23 1213

## 2023-10-10 ENCOUNTER — Emergency Department (HOSPITAL_COMMUNITY)
Admission: EM | Admit: 2023-10-10 | Discharge: 2023-10-10 | Disposition: A | Discharge status: Home / Self Care | Attending: Emergency Medicine | Admitting: Emergency Medicine

## 2023-10-10 ENCOUNTER — Emergency Department (HOSPITAL_COMMUNITY)

## 2023-10-10 DIAGNOSIS — K5792 Diverticulitis of intestine, part unspecified, without perforation or abscess without bleeding: Secondary | ICD-10-CM

## 2023-10-10 DIAGNOSIS — K5732 Diverticulitis of large intestine without perforation or abscess without bleeding: Secondary | ICD-10-CM | POA: Diagnosis not present

## 2023-10-10 DIAGNOSIS — R1030 Lower abdominal pain, unspecified: Secondary | ICD-10-CM | POA: Diagnosis present

## 2023-10-10 DIAGNOSIS — Z7982 Long term (current) use of aspirin: Secondary | ICD-10-CM | POA: Insufficient documentation

## 2023-10-10 LAB — CBC
HCT: 47.4 % (ref 39.0–52.0)
Hemoglobin: 15.5 g/dL (ref 13.0–17.0)
MCH: 28.7 pg (ref 26.0–34.0)
MCHC: 32.7 g/dL (ref 30.0–36.0)
MCV: 87.8 fL (ref 80.0–100.0)
Platelets: 200 K/uL (ref 150–400)
RBC: 5.4 MIL/uL (ref 4.22–5.81)
RDW: 13.3 % (ref 11.5–15.5)
WBC: 11.4 K/uL — ABNORMAL HIGH (ref 4.0–10.5)
nRBC: 0 % (ref 0.0–0.2)

## 2023-10-10 LAB — COMPREHENSIVE METABOLIC PANEL WITH GFR
ALT: 18 U/L (ref 0–44)
AST: 39 U/L (ref 15–41)
Albumin: 4.4 g/dL (ref 3.5–5.0)
Alkaline Phosphatase: 89 U/L (ref 38–126)
Anion gap: 13 (ref 5–15)
BUN: 18 mg/dL (ref 6–20)
CO2: 22 mmol/L (ref 22–32)
Calcium: 9.2 mg/dL (ref 8.9–10.3)
Chloride: 104 mmol/L (ref 98–111)
Creatinine, Ser: 1.01 mg/dL (ref 0.61–1.24)
GFR, Estimated: >60 mL/min (ref >60)
Glucose, Bld: 138 mg/dL — ABNORMAL HIGH (ref 70–99)
Potassium: 3.5 mmol/L (ref 3.5–5.1)
Sodium: 139 mmol/L (ref 135–145)
Total Bilirubin: 0.5 mg/dL (ref 0.0–1.2)
Total Protein: 7.8 g/dL (ref 6.5–8.1)

## 2023-10-10 LAB — LIPASE, BLOOD: Lipase: 34 U/L (ref 11–51)

## 2023-10-10 MED ORDER — HYDROMORPHONE HCL 1 MG/ML IJ SOLN
1.0000 mg | Freq: Once | INTRAMUSCULAR | Status: AC
  Administered 2023-10-10: 1 mg via INTRAVENOUS
  Filled 2023-10-10: qty 1

## 2023-10-10 MED ORDER — ONDANSETRON HCL 4 MG/2ML IJ SOLN
4.0000 mg | Freq: Once | INTRAMUSCULAR | Status: AC
  Administered 2023-10-10: 4 mg via INTRAVENOUS
  Filled 2023-10-10: qty 2

## 2023-10-10 MED ORDER — METRONIDAZOLE 500 MG PO TABS: 500.0000 mg | ORAL_TABLET | Freq: Two times a day (BID) | ORAL | 0 refills | Status: DC

## 2023-10-10 MED ORDER — ONDANSETRON 4 MG PO TBDP: 4.0000 mg | ORAL_TABLET | Freq: Three times a day (TID) | ORAL | 0 refills | Status: DC | PRN

## 2023-10-10 MED ORDER — CIPROFLOXACIN HCL 500 MG PO TABS: 500.0000 mg | ORAL_TABLET | Freq: Two times a day (BID) | ORAL | 0 refills | Status: DC

## 2023-10-10 MED ORDER — OXYCODONE HCL 5 MG PO CAPS: 5.0000 mg | ORAL_CAPSULE | Freq: Four times a day (QID) | ORAL | 0 refills | Status: AC | PRN

## 2023-10-10 MED ORDER — IOHEXOL 300 MG/ML  SOLN
100.0000 mL | Freq: Once | INTRAMUSCULAR | Status: AC | PRN
  Administered 2023-10-10: 100 mL via INTRAVENOUS

## 2023-10-10 NOTE — ED Triage Notes (Signed)
 Patient arrived with complaints of lower abdominal pain and NV x3 days. Hx of diverticulitis

## 2023-10-10 NOTE — ED Notes (Signed)
Gave pt urinal for UA 

## 2023-10-10 NOTE — Discharge Instructions (Signed)
 Please call GI doctor to schedule follow-up. Would recommend clear liquid diet for 2 days and then followed by bland diet, after 4-5 days transition back into your normal diet.  You can take antibiotics as prescribed.  Take Zofran  as needed for nausea or vomiting.  Take oxycodone  for breakthrough pain.  Return to ER with new or worsening symptoms.

## 2023-10-10 NOTE — ED Provider Notes (Signed)
 [place] [facility] AT [facility] Provider Note   CSN: [id number] Arrival date & time: 10/10/23  8086     Patient presents with: Abdominal Pain   Shane Arroyo is a 48 y.o. male with past medical history of diverticulitis, GERD is presenting to emergency room with complaint of 5 days of lower abdominal pain worse in the left lower quadrant.  This is associated with nausea.  Denies any vomiting diarrhea.  He is passing gas.  He denies any fever.  Reports he is eating and drinking okay he thinks this was flared up after drinking alcohol.     Abdominal Pain      Prior to Admission medications   Medication Sig Start Date End Date Taking? Authorizing Provider  aspirin EC 81 MG tablet Take 81 mg by mouth daily. 06/18/21   [provider]  cephALEXin (KEFLEX) 500 MG capsule Take 500 mg by mouth 4 (four) times daily.    [provider]  ciclopirox  (LOPROX ) 0.77 % cream Apply topically 2 (two) times daily. 06/01/23   Teresa Almarie LABOR, PA-C  ciprofloxacin  (CIPRO ) 500 MG tablet Take 500 mg by mouth 2 (two) times daily.    [provider]  diclofenac  Sodium (VOLTAREN ) 1 % GEL Apply 2 g topically 4 (four) times daily. Patient not taking: Reported on 06/16/2021 03/03/20   Leotis Sole, PA-C  docusate sodium (COLACE) 100 MG capsule Take 100 mg by mouth 2 (two) times daily. 06/18/21   [provider]  econazole nitrate  1 % cream Apply topically 2 (two) times daily. 02/07/22   Lynwood Lenis, PA-C  famotidine  (PEPCID ) 20 MG tablet Take 1 tablet (20 mg total) by mouth 2 (two) times daily. Patient not taking: Reported on 06/16/2021 02/26/20   Donah Riis A, PA-C  fluconazole  (DIFLUCAN ) 150 MG tablet Take 150 mg by mouth once.    [provider]  ibuprofen  (ADVIL ) 800 MG tablet Take 800 mg by mouth every 6 (six) hours as needed for mild pain.    [provider]  magic mouthwash (lidocaine , diphenhydrAMINE , alum & mag  hydroxide) suspension Swish and swallow 10 mLs 4 (four) times daily. 04/10/22   Lynwood Lenis, PA-C  methocarbamol  (ROBAXIN ) 500 MG tablet Take 1 tablet (500 mg total) by mouth 2 (two) times daily. Patient not taking: Reported on 06/16/2021 03/03/20   Khatri, Hina, PA-C  metroNIDAZOLE  (FLAGYL ) 500 MG tablet Take 1 tablet (500 mg total) by mouth 2 (two) times daily. Patient taking differently: Take 500 mg by mouth daily as needed (diverticulitis). 09/07/20   Aneita Gwendlyn DASEN, MD  Naproxen  Sodium (ALEVE ) 220 MG CAPS Take 220 mg by mouth every 12 (twelve) hours. 07/18/21   [provider]  ondansetron  (ZOFRAN ) 8 MG tablet Take 8 mg by mouth every 8 (eight) hours as needed. 06/18/21   [provider]  ondansetron  (ZOFRAN ) 8 MG tablet Take 8 mg by mouth every 8 (eight) hours as needed. 06/18/21   [provider]  oxyCODONE  (OXY IR/ROXICODONE ) 5 MG immediate release tablet Take 5 mg by mouth every 6 (six) hours as needed.    [provider]  oxyCODONE -acetaminophen  (PERCOCET) 5-325 MG tablet Take 1 tablet by mouth every 4 (four) hours as needed. 06/12/21   Jarold Olam HERO, PA-C  pantoprazole  (PROTONIX ) 20 MG tablet Take 1 tablet (20 mg total) by mouth daily. Patient taking differently: Take 20 mg by mouth daily as needed (diverticulitis). 02/26/20   Donah Riis A, PA-C  terbinafine  (LAMISIL )  250 MG tablet Take 1 tablet (250 mg total) by mouth daily. 07/02/22   Merilee Andrea CROME, NP    Allergies: Patient has no known allergies.    Review of Systems  Gastrointestinal:  Positive for abdominal pain.    Updated Vital Signs BP (!) 206/125 (BP Location: Left Arm)   Pulse 94   Temp 97.7 F (36.5 C) (Oral)   Resp 19   SpO2 98%   Physical Exam Vitals and nursing note reviewed.  Constitutional:      General: He is not in acute distress.    Appearance: He is not toxic-appearing.  HENT:     Head: Normocephalic and atraumatic.  Eyes:     General: No scleral icterus.     Conjunctiva/sclera: Conjunctivae normal.  Cardiovascular:     Rate and Rhythm: Normal rate and regular rhythm.     Pulses: Normal pulses.     Heart sounds: Normal heart sounds.  Pulmonary:     Effort: Pulmonary effort is normal. No respiratory distress.     Breath sounds: Normal breath sounds.  Abdominal:     General: Abdomen is flat. Bowel sounds are normal.     Palpations: Abdomen is soft.     Tenderness: There is abdominal tenderness.  Skin:    General: Skin is warm and dry.     Findings: No lesion.  Neurological:     General: No focal deficit present.     Mental Status: He is alert and oriented to person, place, and time. Mental status is at baseline.     (all labs ordered are listed, but only abnormal results are displayed) Labs Reviewed  CBC - Abnormal; Notable for the following components:      Result Value   WBC 11.4 (*)    All other components within normal limits  LIPASE, BLOOD  COMPREHENSIVE METABOLIC PANEL WITH GFR  URINALYSIS, ROUTINE W REFLEX MICROSCOPIC    EKG: None  Radiology: No results found.   Procedures   Medications Ordered in the ED  ondansetron  (ZOFRAN ) injection 4 mg (has no administration in time range)  HYDROmorphone  (DILAUDID ) injection 1 mg (has no administration in time range)    Clinical Course as of 10/10/23 2117  Thu Oct 10, 2023  2110 Passed PO challenge, pain well controlled.  [JB]    Clinical Course User Index [JB] Kaisen Ackers, Warren SAILOR, PA-C                                 Medical Decision Making Amount and/or Complexity of Data Reviewed Labs: ordered. Radiology: ordered.  Risk Prescription drug management.   This patient presents to the ED for concern of abdominal pain, this involves an extensive number of treatment options, and is a complaint that carries with it a high risk of complications and morbidity.  The differential diagnosis includes cholecystitis, AAA, appendicitis, renal stone, UTI   Co morbidities that  complicate the patient evaluation  Hx of diverticulitis    Additional history obtained:  Additional history obtained from GERD, diverticulitis   Lab Tests:  I personally interpreted labs.  The pertinent results include:   Mild leukocytosis at 11.4 otherwise reassuring labs   Imaging Studies ordered:  I ordered imaging studies including CT scan of abdomen and pelvis I independently visualized and interpreted imaging which showed acute diverticulitis of proximal sigmoid colon without perforation or abscess I agree with the radiologist interpretation   Cardiac Monitoring: /  EKG:  The patient was maintained on a cardiac monitor.     Problem List / ED Course / Critical interventions / Medication management  Patient presents with 4 days of abdominal pain.  He has been tolerating oral intake.  He has not had fevers with this.  On my exam he has focal left lower quadrant tenderness to palpation.  He is otherwise hemodynamically stable.  I did order CT scan show which shows acute diverticulitis.  This seems consistent with patient's symptoms.  He has mild leukocytosis. I ordered medication including Zofran , Dilaudid   Reevaluation of the patient after these medicines showed that the patient improved I have reviewed the patients home medicines and have made adjustments as needed. Patient passed p.o. challenge.  His pain is well-controlled.  He has no sign of systemic illness.  Will start him on antibiotics and give GI follow-up. Has done well on Flagyl , Cipro  requesting the same treatment today. He is stable for discharge with outpatient follow-up.  Given strict return precautions.      Final diagnoses:  Diverticulitis    ED Discharge Orders          Ordered    metroNIDAZOLE  (FLAGYL ) 500 MG tablet  2 times daily        10/10/23 2113    ciprofloxacin  (CIPRO ) 500 MG tablet  Every 12 hours        10/10/23 2113    ondansetron  (ZOFRAN -ODT) 4 MG disintegrating tablet  Every 8 hours  PRN        10/10/23 2113    oxycodone  (OXY-IR) 5 MG capsule  Every 6 hours PRN        10/10/23 2113               Shane Poitra N, PA-C 10/10/23 2119    Randol Simmonds, MD 10/11/23 1210

## 2023-10-11 ENCOUNTER — Telehealth (HOSPITAL_COMMUNITY): Payer: Self-pay | Admitting: Student

## 2023-10-11 MED ORDER — ONDANSETRON 4 MG PO TBDP: 4.0000 mg | ORAL_TABLET | Freq: Three times a day (TID) | ORAL | 0 refills | Status: AC | PRN

## 2023-10-11 MED ORDER — METRONIDAZOLE 500 MG PO TABS: 500.0000 mg | ORAL_TABLET | Freq: Two times a day (BID) | ORAL | 0 refills | Status: AC

## 2023-10-11 MED ORDER — CIPROFLOXACIN HCL 500 MG PO TABS: 500.0000 mg | ORAL_TABLET | Freq: Two times a day (BID) | ORAL | 0 refills | Status: AC

## 2023-10-11 NOTE — Telephone Encounter (Cosign Needed)
 Seen yesterday in the ED and diagnosed with diverticulitis.  Requesting to change prescription to [facility] on [address].  Zofran , ciprofloxacin , and Flagyl  sent.

## 2023-10-12 ENCOUNTER — Emergency Department (HOSPITAL_COMMUNITY)
Admission: EM | Admit: 2023-10-12 | Discharge: 2023-10-12 | Disposition: A | Discharge status: Home / Self Care | Attending: Emergency Medicine | Admitting: Emergency Medicine

## 2023-10-12 ENCOUNTER — Other Ambulatory Visit: Payer: Self-pay

## 2023-10-12 ENCOUNTER — Encounter (HOSPITAL_COMMUNITY): Payer: Self-pay | Admitting: Emergency Medicine

## 2023-10-12 DIAGNOSIS — K5792 Diverticulitis of intestine, part unspecified, without perforation or abscess without bleeding: Secondary | ICD-10-CM

## 2023-10-12 DIAGNOSIS — K5732 Diverticulitis of large intestine without perforation or abscess without bleeding: Secondary | ICD-10-CM | POA: Insufficient documentation

## 2023-10-12 DIAGNOSIS — Z7982 Long term (current) use of aspirin: Secondary | ICD-10-CM | POA: Insufficient documentation

## 2023-10-12 DIAGNOSIS — R1032 Left lower quadrant pain: Secondary | ICD-10-CM | POA: Diagnosis present

## 2023-10-12 LAB — COMPREHENSIVE METABOLIC PANEL WITH GFR
ALT: 20 U/L (ref 0–44)
AST: 29 U/L (ref 15–41)
Albumin: 4.1 g/dL (ref 3.5–5.0)
Alkaline Phosphatase: 76 U/L (ref 38–126)
Anion gap: 11 (ref 5–15)
BUN: 15 mg/dL (ref 6–20)
CO2: 25 mmol/L (ref 22–32)
Calcium: 9.2 mg/dL (ref 8.9–10.3)
Chloride: 106 mmol/L (ref 98–111)
Creatinine, Ser: 0.96 mg/dL (ref 0.61–1.24)
GFR, Estimated: >60 mL/min (ref >60)
Glucose, Bld: 111 mg/dL — ABNORMAL HIGH (ref 70–99)
Potassium: 4.4 mmol/L (ref 3.5–5.1)
Sodium: 142 mmol/L (ref 135–145)
Total Bilirubin: 0.4 mg/dL (ref 0.0–1.2)
Total Protein: 7.4 g/dL (ref 6.5–8.1)

## 2023-10-12 LAB — CBC WITH DIFFERENTIAL/PLATELET
Abs Immature Granulocytes: 0.01 K/uL (ref 0.00–0.07)
Basophils Absolute: 0 K/uL (ref 0.0–0.1)
Basophils Relative: 1 %
Eosinophils Absolute: 0.1 K/uL (ref 0.0–0.5)
Eosinophils Relative: 2 %
HCT: 44.7 % (ref 39.0–52.0)
Hemoglobin: 14.6 g/dL (ref 13.0–17.0)
Immature Granulocytes: 0 %
Lymphocytes Relative: 30 %
Lymphs Abs: 2.3 K/uL (ref 0.7–4.0)
MCH: 29.4 pg (ref 26.0–34.0)
MCHC: 32.7 g/dL (ref 30.0–36.0)
MCV: 89.9 fL (ref 80.0–100.0)
Monocytes Absolute: 0.7 K/uL (ref 0.1–1.0)
Monocytes Relative: 9 %
Neutro Abs: 4.4 K/uL (ref 1.7–7.7)
Neutrophils Relative %: 58 %
Platelets: 192 K/uL (ref 150–400)
RBC: 4.97 MIL/uL (ref 4.22–5.81)
RDW: 13.5 % (ref 11.5–15.5)
WBC: 7.5 K/uL (ref 4.0–10.5)
nRBC: 0 % (ref 0.0–0.2)

## 2023-10-12 LAB — I-STAT CG4 LACTIC ACID, ED
Lactic Acid, Venous: 0.9 mmol/L (ref 0.5–1.9)
Lactic Acid, Venous: 1 mmol/L (ref 0.5–1.9)

## 2023-10-12 MED ORDER — HYDROCODONE-ACETAMINOPHEN 5-325 MG PO TABS: 1.0000 | ORAL_TABLET | Freq: Four times a day (QID) | ORAL | 0 refills | Status: AC | PRN

## 2023-10-12 MED ORDER — AMOXICILLIN-POT CLAVULANATE 875-125 MG PO TABS: 1.0000 | ORAL_TABLET | Freq: Two times a day (BID) | ORAL | 0 refills | Status: AC

## 2023-10-12 MED ORDER — MORPHINE SULFATE (PF) 4 MG/ML IV SOLN
4.0000 mg | Freq: Once | INTRAVENOUS | Status: AC
  Administered 2023-10-12: 4 mg via INTRAVENOUS
  Filled 2023-10-12: qty 1

## 2023-10-12 MED ORDER — SODIUM CHLORIDE 0.9 % IV BOLUS
1000.0000 mL | Freq: Once | INTRAVENOUS | Status: AC
  Administered 2023-10-12: 1000 mL via INTRAVENOUS

## 2023-10-12 MED ORDER — PIPERACILLIN-TAZOBACTAM 3.375 G IVPB 30 MIN
3.3750 g | Freq: Once | INTRAVENOUS | Status: AC
  Administered 2023-10-12: 3.375 g via INTRAVENOUS
  Filled 2023-10-12: qty 50

## 2023-10-12 NOTE — Discharge Instructions (Addendum)
 You have diverticulitis and you likely have side effect from your antibiotics   Please stop Cipro  and Flagyl  as you likely have side effect from the medication  I have prescribed Augmentin twice daily for a week  I have also prescribed Norco as needed for severe pain  See your doctor for follow-up  I recommend that you follow-up with GI as well  Return to ER if you have severe abdominal pain or vomiting or fever

## 2023-10-12 NOTE — ED Provider Notes (Signed)
 Shane Arroy[facility] EMERGENCY DEPARTMENT AT [facility] Provider Note   CSN: [id number] Arrival date & time: 10/12/23  1745     Patient presents with: Abdominal Pain   Shane Arroyo is a 48 y.o. male history of diverticulitis, here presenting with abdominal pain.  Patient was seen here 2 days ago and had white blood cell count of 11 and CT abdomen pelvis showed uncomplicated diverticulitis.  Patient was prescribed Cipro  and Flagyl .  He states that he has been taking it as prescribed.  He states that he took 2 doses yesterday and was doing fine.  He states that today he has progressively worsening left lower quadrant pain radiated to his testicle.  Patient states that he has some chills but no fever.    The history is provided by the patient.       Prior to Admission medications   Medication Sig Start Date End Date Taking? Authorizing Provider  aspirin EC 81 MG tablet Take 81 mg by mouth daily. 06/18/21   [provider]  cephALEXin (KEFLEX) 500 MG capsule Take 500 mg by mouth 4 (four) times daily.    [provider]  ciclopirox  (LOPROX ) 0.77 % cream Apply topically 2 (two) times daily. 06/01/23   White, Elizabeth A, PA-C  ciprofloxacin  (CIPRO ) 500 MG tablet Take 1 tablet (500 mg total) by mouth every 12 (twelve) hours for 5 days. 10/11/23 10/16/23  Aberman, Caroline C, PA-C  diclofenac  Sodium (VOLTAREN ) 1 % GEL Apply 2 g topically 4 (four) times daily. Patient not taking: Reported on 06/16/2021 03/03/20   Leotis Sole, PA-C  docusate sodium (COLACE) 100 MG capsule Take 100 mg by mouth 2 (two) times daily. 06/18/21   [provider]  econazole nitrate  1 % cream Apply topically 2 (two) times daily. 02/07/22   Lynwood Lenis, PA-C  famotidine  (PEPCID ) 20 MG tablet Take 1 tablet (20 mg total) by mouth 2 (two) times daily. Patient not taking: Reported on 06/16/2021 02/26/20   Donah Riis A, PA-C  fluconazole  (DIFLUCAN ) 150 MG tablet Take 150 mg by mouth once.     [provider]  ibuprofen  (ADVIL ) 800 MG tablet Take 800 mg by mouth every 6 (six) hours as needed for mild pain.    [provider]  magic mouthwash (lidocaine , diphenhydrAMINE , alum & mag hydroxide) suspension Swish and swallow 10 mLs 4 (four) times daily. 04/10/22   Lynwood Lenis, PA-C  methocarbamol  (ROBAXIN ) 500 MG tablet Take 1 tablet (500 mg total) by mouth 2 (two) times daily. Patient not taking: Reported on 06/16/2021 03/03/20   Khatri, Hina, PA-C  metroNIDAZOLE  (FLAGYL ) 500 MG tablet Take 1 tablet (500 mg total) by mouth 2 (two) times daily for 5 days. 10/11/23 10/16/23  Lorelle Aleck BROCKS, PA-C  Naproxen  Sodium (ALEVE ) 220 MG CAPS Take 220 mg by mouth every 12 (twelve) hours. 07/18/21   [provider]  ondansetron  (ZOFRAN ) 8 MG tablet Take 8 mg by mouth every 8 (eight) hours as needed. 06/18/21   [provider]  ondansetron  (ZOFRAN ) 8 MG tablet Take 8 mg by mouth every 8 (eight) hours as needed. 06/18/21   [provider]  ondansetron  (ZOFRAN -ODT) 4 MG disintegrating tablet Take 1 tablet (4 mg total) by mouth every 8 (eight) hours as needed for nausea or vomiting. 10/11/23   Aberman, Caroline C, PA-C  oxycodone  (OXY-IR) 5 MG capsule Take 1 capsule (5 mg total) by mouth every 6 (six) hours as needed (severe pain, breakthrough pain). 10/10/23  Barrett, Warren SAILOR, PA-C  oxyCODONE -acetaminophen  (PERCOCET) 5-325 MG tablet Take 1 tablet by mouth every 4 (four) hours as needed. 06/12/21   Jarold Olam HERO, PA-C  pantoprazole  (PROTONIX ) 20 MG tablet Take 1 tablet (20 mg total) by mouth daily. Patient taking differently: Take 20 mg by mouth daily as needed (diverticulitis). 02/26/20   Donah Penne LABOR, PA-C  terbinafine  (LAMISIL ) 250 MG tablet Take 1 tablet (250 mg total) by mouth daily. 07/02/22   Merilee Andrea CROME, NP    Allergies: Patient has no known allergies.    Review of Systems  Gastrointestinal:  Positive for abdominal pain.  All other systems  reviewed and are negative.   Updated Vital Signs BP (!) 151/105 (BP Location: Left Arm)   Pulse 79   Temp 98 F (36.7 C) (Oral)   Resp 18   SpO2 100%   Physical Exam Vitals and nursing note reviewed.  Constitutional:      Comments: Uncomfortable  HENT:     Head: Normocephalic.     Mouth/Throat:     Mouth: Mucous membranes are moist.  Eyes:     Extraocular Movements: Extraocular movements intact.     Pupils: Pupils are equal, round, and reactive to light.  Cardiovascular:     Rate and Rhythm: Normal rate.  Pulmonary:     Effort: Pulmonary effort is normal.     Breath sounds: Normal breath sounds.  Abdominal:     General: Abdomen is flat.     Comments: + LLQ tenderness   Skin:    General: Skin is warm.     Capillary Refill: Capillary refill takes less than 2 seconds.  Neurological:     General: No focal deficit present.     Mental Status: He is alert and oriented to person, place, and time.  Psychiatric:        Mood and Affect: Mood normal.        Behavior: Behavior normal.     (all labs ordered are listed, but only abnormal results are displayed) Labs Reviewed  CULTURE, BLOOD (ROUTINE X 2)  CULTURE, BLOOD (ROUTINE X 2)  CBC WITH DIFFERENTIAL/PLATELET  COMPREHENSIVE METABOLIC PANEL WITH GFR  I-STAT CG4 LACTIC ACID, ED    EKG: None  Radiology: CT ABDOMEN PELVIS W CONTRAST Result Date: 10/10/2023 EXAM: CT ABDOMEN AND PELVIS WITH CONTRAST 10/10/2023 08:56:55 PM TECHNIQUE: CT of the abdomen and pelvis was performed with the administration of 100 mL of iohexol  (OMNIPAQUE ) 300 MG/ML solution. Multiplanar reformatted images are provided for review. Automated exposure control, iterative reconstruction, and/or weight-based adjustment of the mA/kV was utilized to reduce the radiation dose to as low as reasonably achievable. COMPARISON: 11/08/2018 CLINICAL HISTORY: Abdominal pain, acute, nonlocalized. Patient arrived with complaints of lower abdominal pain and NV x3 days.  Hx of diverticulitis. FINDINGS: LOWER CHEST: No acute abnormality. LIVER: The liver is unremarkable. GALLBLADDER AND BILE DUCTS: Gallbladder is unremarkable. No biliary ductal dilatation. SPLEEN: No acute abnormality. PANCREAS: No acute abnormality. ADRENAL GLANDS: No acute abnormality. KIDNEYS, URETERS AND BLADDER: No stones in the kidneys or ureters. No hydronephrosis. No perinephric or periureteral stranding. Urinary bladder is unremarkable. GI AND BOWEL: Stomach demonstrates no acute abnormality. Colonic diverticulosis, with segmental wall thickening and pericolonic fat stranding involving the proximal sigmoid colon compatible with acute diverticulitis. No perforation, fluid collection, or abscess. There is no bowel obstruction. PERITONEUM AND RETROPERITONEUM: No ascites. No free air. VASCULATURE: Aorta is normal in caliber. LYMPH NODES: No lymphadenopathy. REPRODUCTIVE ORGANS: No acute abnormality. BONES AND  SOFT TISSUES: No acute osseous abnormality. No focal soft tissue abnormality. IMPRESSION: 1. Acute diverticulitis of the proximal sigmoid colon. No perforation, fluid collection, or abscess. Electronically signed by: Ozell Daring MD 10/10/2023 09:03 PM EDT RP Workstation: [id number]     Procedures   Medications Ordered in the ED  sodium chloride  0.9 % bolus 1,000 mL (has no administration in time range)  morphine  (PF) 4 MG/ML injection 4 mg (has no administration in time range)  piperacillin-tazobactam (ZOSYN) IVPB 3.375 g (has no administration in time range)                                    Medical Decision Making Darcey G Cullinane is a 48 y.o. male here presenting with abdominal pain.  Patient was just seen here 2 days ago and had CT abdomen pelvis 2 days ago that showed uncomplicated diverticulitis.  Concern for possible worsening diverticulitis.  Plan to get CBC CMP and lactate and culture and will give IV Zosyn and IV fluids.  7:57 PM I reviewed patient's labs and they were  unremarkable.  In particular white blood cell count is normal.  Patient is feeling much better.  I wonder if he has side effects especially from the Flagyl .  I do not think he needs a repeat CT scan but have low suspicion that he has a intra-abdominal abscess.  Patient received Zosyn and will switch him to Augmentin  Problems Addressed: Diverticulitis: acute illness or injury  Amount and/or Complexity of Data Reviewed Labs: ordered. Decision-making details documented in ED Course.  Risk Prescription drug management.    Final diagnoses:  None    ED Discharge Orders     None          Patt Alm Macho, MD 10/12/23 2001

## 2023-10-12 NOTE — ED Triage Notes (Signed)
 Pt presents with worsening abdominal pain, diffuse in nature. States hx of diverticulitis.  Seen 2 days ago for same. Has been taking anbx at home.  Nausea, no vomiting. No fever or chills.

## 2023-10-17 LAB — CULTURE, BLOOD (ROUTINE X 2)
Culture: NO GROWTH
Culture: NO GROWTH
Special Requests: ADEQUATE
